# Patient Record
Sex: Female | Born: 1945 | Race: White | Hispanic: No | Smoking: Never smoker
Health system: Southern US, Community
[De-identification: ages and names within clinical notes are randomized; demographics above are authoritative.]

## PROBLEM LIST (undated history)

## (undated) DIAGNOSIS — F1910 Other psychoactive substance abuse, uncomplicated: Secondary | ICD-10-CM

## (undated) HISTORY — PX: BREAST LUMPECTOMY: SHX2

---

## 2020-12-18 ENCOUNTER — Inpatient Hospital Stay (HOSPITAL_COMMUNITY): Payer: Medicare HMO

## 2020-12-18 ENCOUNTER — Encounter (HOSPITAL_COMMUNITY): Payer: Self-pay | Admitting: Cardiology

## 2020-12-18 ENCOUNTER — Inpatient Hospital Stay (HOSPITAL_COMMUNITY)
Admission: AD | Admit: 2020-12-18 | Discharge: 2020-12-22 | DRG: 308 | Disposition: A | Discharge status: Home / Self Care | Payer: Medicare HMO | Attending: Cardiology | Admitting: Cardiology

## 2020-12-18 DIAGNOSIS — D631 Anemia in chronic kidney disease: Secondary | ICD-10-CM | POA: Diagnosis present

## 2020-12-18 DIAGNOSIS — I5021 Acute systolic (congestive) heart failure: Secondary | ICD-10-CM | POA: Diagnosis not present

## 2020-12-18 DIAGNOSIS — I272 Pulmonary hypertension, unspecified: Secondary | ICD-10-CM | POA: Diagnosis present

## 2020-12-18 DIAGNOSIS — I509 Heart failure, unspecified: Secondary | ICD-10-CM

## 2020-12-18 DIAGNOSIS — I5043 Acute on chronic combined systolic (congestive) and diastolic (congestive) heart failure: Secondary | ICD-10-CM | POA: Diagnosis present

## 2020-12-18 DIAGNOSIS — I5020 Unspecified systolic (congestive) heart failure: Secondary | ICD-10-CM

## 2020-12-18 DIAGNOSIS — Z20822 Contact with and (suspected) exposure to covid-19: Secondary | ICD-10-CM | POA: Diagnosis present

## 2020-12-18 DIAGNOSIS — Z95828 Presence of other vascular implants and grafts: Secondary | ICD-10-CM

## 2020-12-18 DIAGNOSIS — I10 Essential (primary) hypertension: Secondary | ICD-10-CM

## 2020-12-18 DIAGNOSIS — Z79890 Hormone replacement therapy (postmenopausal): Secondary | ICD-10-CM

## 2020-12-18 DIAGNOSIS — K219 Gastro-esophageal reflux disease without esophagitis: Secondary | ICD-10-CM | POA: Diagnosis present

## 2020-12-18 DIAGNOSIS — I480 Paroxysmal atrial fibrillation: Secondary | ICD-10-CM | POA: Diagnosis not present

## 2020-12-18 DIAGNOSIS — Z79899 Other long term (current) drug therapy: Secondary | ICD-10-CM

## 2020-12-18 DIAGNOSIS — Z7982 Long term (current) use of aspirin: Secondary | ICD-10-CM | POA: Diagnosis not present

## 2020-12-18 DIAGNOSIS — Z9221 Personal history of antineoplastic chemotherapy: Secondary | ICD-10-CM | POA: Diagnosis not present

## 2020-12-18 DIAGNOSIS — I371 Nonrheumatic pulmonary valve insufficiency: Secondary | ICD-10-CM | POA: Diagnosis present

## 2020-12-18 DIAGNOSIS — I5041 Acute combined systolic (congestive) and diastolic (congestive) heart failure: Secondary | ICD-10-CM

## 2020-12-18 DIAGNOSIS — N179 Acute kidney failure, unspecified: Secondary | ICD-10-CM | POA: Diagnosis present

## 2020-12-18 DIAGNOSIS — I440 Atrioventricular block, first degree: Secondary | ICD-10-CM | POA: Diagnosis present

## 2020-12-18 DIAGNOSIS — K7460 Unspecified cirrhosis of liver: Secondary | ICD-10-CM | POA: Diagnosis present

## 2020-12-18 DIAGNOSIS — E119 Type 2 diabetes mellitus without complications: Secondary | ICD-10-CM

## 2020-12-18 DIAGNOSIS — N171 Acute kidney failure with acute cortical necrosis: Secondary | ICD-10-CM

## 2020-12-18 DIAGNOSIS — J90 Pleural effusion, not elsewhere classified: Secondary | ICD-10-CM | POA: Diagnosis present

## 2020-12-18 DIAGNOSIS — E785 Hyperlipidemia, unspecified: Secondary | ICD-10-CM | POA: Diagnosis present

## 2020-12-18 DIAGNOSIS — I495 Sick sinus syndrome: Secondary | ICD-10-CM | POA: Diagnosis not present

## 2020-12-18 DIAGNOSIS — I482 Chronic atrial fibrillation, unspecified: Secondary | ICD-10-CM

## 2020-12-18 DIAGNOSIS — E039 Hypothyroidism, unspecified: Secondary | ICD-10-CM | POA: Diagnosis present

## 2020-12-18 DIAGNOSIS — I4891 Unspecified atrial fibrillation: Secondary | ICD-10-CM

## 2020-12-18 DIAGNOSIS — I130 Hypertensive heart and chronic kidney disease with heart failure and stage 1 through stage 4 chronic kidney disease, or unspecified chronic kidney disease: Secondary | ICD-10-CM | POA: Diagnosis present

## 2020-12-18 DIAGNOSIS — I4819 Other persistent atrial fibrillation: Secondary | ICD-10-CM | POA: Diagnosis present

## 2020-12-18 DIAGNOSIS — Z853 Personal history of malignant neoplasm of breast: Secondary | ICD-10-CM | POA: Diagnosis not present

## 2020-12-18 DIAGNOSIS — R188 Other ascites: Secondary | ICD-10-CM | POA: Diagnosis present

## 2020-12-18 DIAGNOSIS — E1122 Type 2 diabetes mellitus with diabetic chronic kidney disease: Secondary | ICD-10-CM | POA: Diagnosis present

## 2020-12-18 DIAGNOSIS — N184 Chronic kidney disease, stage 4 (severe): Secondary | ICD-10-CM | POA: Diagnosis present

## 2020-12-18 DIAGNOSIS — I428 Other cardiomyopathies: Secondary | ICD-10-CM

## 2020-12-18 DIAGNOSIS — Z888 Allergy status to other drugs, medicaments and biological substances status: Secondary | ICD-10-CM | POA: Diagnosis not present

## 2020-12-18 DIAGNOSIS — N189 Chronic kidney disease, unspecified: Secondary | ICD-10-CM

## 2020-12-18 HISTORY — DX: Essential (primary) hypertension: I10

## 2020-12-18 HISTORY — DX: Unspecified systolic (congestive) heart failure: I50.20

## 2020-12-18 HISTORY — DX: Unspecified cirrhosis of liver: K74.60

## 2020-12-18 HISTORY — DX: Other cardiomyopathies: I42.8

## 2020-12-18 HISTORY — DX: Chronic kidney disease, unspecified: N18.9

## 2020-12-18 HISTORY — DX: Pulmonary hypertension, unspecified: I27.20

## 2020-12-18 HISTORY — DX: Personal history of malignant neoplasm of breast: Z85.3

## 2020-12-18 HISTORY — DX: Unspecified atrial fibrillation: I48.91

## 2020-12-18 LAB — HEPATIC FUNCTION PANEL
ALT: 79 U/L — ABNORMAL HIGH (ref 0–44)
AST: 101 U/L — ABNORMAL HIGH (ref 15–41)
Albumin: 3.6 g/dL (ref 3.5–5.0)
Alkaline Phosphatase: 112 U/L (ref 38–126)
Bilirubin, Direct: 0.3 mg/dL — ABNORMAL HIGH (ref 0.0–0.2)
Indirect Bilirubin: 0.6 mg/dL (ref 0.3–0.9)
Total Bilirubin: 0.9 mg/dL (ref 0.3–1.2)
Total Protein: 7.7 g/dL (ref 6.5–8.1)

## 2020-12-18 LAB — CBC WITH DIFFERENTIAL/PLATELET
Abs Immature Granulocytes: 0.02 10*3/uL (ref 0.00–0.07)
Basophils Absolute: 0 10*3/uL (ref 0.0–0.1)
Basophils Relative: 0 %
Eosinophils Absolute: 0.1 10*3/uL (ref 0.0–0.5)
Eosinophils Relative: 2 %
HCT: 35.5 % — ABNORMAL LOW (ref 36.0–46.0)
Hemoglobin: 11.4 g/dL — ABNORMAL LOW (ref 12.0–15.0)
Immature Granulocytes: 0 %
Lymphocytes Relative: 17 %
Lymphs Abs: 0.8 10*3/uL (ref 0.7–4.0)
MCH: 32 pg (ref 26.0–34.0)
MCHC: 32.1 g/dL (ref 30.0–36.0)
MCV: 99.7 fL (ref 80.0–100.0)
Monocytes Absolute: 0.5 10*3/uL (ref 0.1–1.0)
Monocytes Relative: 12 %
Neutro Abs: 3.1 10*3/uL (ref 1.7–7.7)
Neutrophils Relative %: 69 %
Platelets: 121 10*3/uL — ABNORMAL LOW (ref 150–400)
RBC: 3.56 MIL/uL — ABNORMAL LOW (ref 3.87–5.11)
RDW: 14.3 % (ref 11.5–15.5)
WBC: 4.5 10*3/uL (ref 4.0–10.5)
nRBC: 0 % (ref 0.0–0.2)

## 2020-12-18 LAB — BRAIN NATRIURETIC PEPTIDE: B Natriuretic Peptide: 1945.1 pg/mL — ABNORMAL HIGH (ref 0.0–100.0)

## 2020-12-18 LAB — GLUCOSE, CAPILLARY
Glucose-Capillary: 86 mg/dL (ref 70–99)
Glucose-Capillary: 77 mg/dL (ref 70–99)
Glucose-Capillary: 77 mg/dL (ref 70–99)
Glucose-Capillary: 76 mg/dL (ref 70–99)
Glucose-Capillary: 189 mg/dL — ABNORMAL HIGH (ref 70–99)

## 2020-12-18 LAB — PROTIME-INR
INR: 1.1 (ref 0.8–1.2)
Prothrombin Time: 13.8 seconds (ref 11.4–15.2)

## 2020-12-18 LAB — ECHOCARDIOGRAM COMPLETE
Area-P 1/2: 3.93 cm2
Calc EF: 56.5 %
Height: 62 in
S' Lateral: 4.57 cm
Single Plane A2C EF: 56.6 %
Single Plane A4C EF: 55.4 %
Weight: 3072.33 oz

## 2020-12-18 LAB — MAGNESIUM: Magnesium: 2.4 mg/dL (ref 1.7–2.4)

## 2020-12-18 LAB — PHOSPHORUS: Phosphorus: 4.7 mg/dL — ABNORMAL HIGH (ref 2.5–4.6)

## 2020-12-18 LAB — BASIC METABOLIC PANEL
Anion gap: 13 (ref 5–15)
BUN: 67 mg/dL — ABNORMAL HIGH (ref 8–23)
CO2: 27 mmol/L (ref 22–32)
Calcium: 9.1 mg/dL (ref 8.9–10.3)
Chloride: 94 mmol/L — ABNORMAL LOW (ref 98–111)
Creatinine, Ser: 2.3 mg/dL — ABNORMAL HIGH (ref 0.44–1.00)
GFR, Estimated: 22 mL/min — ABNORMAL LOW (ref >60)
Glucose, Bld: 91 mg/dL (ref 70–99)
Potassium: 3.8 mmol/L (ref 3.5–5.1)
Sodium: 134 mmol/L — ABNORMAL LOW (ref 135–145)

## 2020-12-18 LAB — HEMOGLOBIN A1C
Hgb A1c MFr Bld: 6.6 % — ABNORMAL HIGH (ref 4.8–5.6)
Mean Plasma Glucose: 142.72 mg/dL

## 2020-12-18 LAB — MRSA PCR SCREENING: MRSA by PCR: NEGATIVE

## 2020-12-18 MED ORDER — LEVOTHYROXINE SODIUM 112 MCG PO TABS
112.0000 ug | ORAL_TABLET | Freq: Every day | ORAL | Status: DC
  Administered 2020-12-20 – 2020-12-22 (×3): 112 ug via ORAL
  Filled 2020-12-18 (×3): qty 1

## 2020-12-18 MED ORDER — FUROSEMIDE 10 MG/ML IJ SOLN
40.0000 mg | Freq: Two times a day (BID) | INTRAMUSCULAR | Status: DC
  Administered 2020-12-19 – 2020-12-21 (×5): 40 mg via INTRAVENOUS
  Filled 2020-12-18 (×5): qty 4

## 2020-12-18 MED ORDER — SODIUM CHLORIDE 0.9% FLUSH
3.0000 mL | Freq: Two times a day (BID) | INTRAVENOUS | Status: DC
  Administered 2020-12-18 – 2020-12-22 (×8): 3 mL via INTRAVENOUS

## 2020-12-18 MED ORDER — FUROSEMIDE 10 MG/ML IJ SOLN
40.0000 mg | Freq: Once | INTRAMUSCULAR | Status: AC
  Administered 2020-12-18: 40 mg via INTRAVENOUS
  Filled 2020-12-18: qty 4

## 2020-12-18 MED ORDER — SODIUM CHLORIDE 0.9% FLUSH
3.0000 mL | INTRAVENOUS | Status: DC | PRN
Start: 2020-12-18 — End: 2020-12-22

## 2020-12-18 MED ORDER — CHLORHEXIDINE GLUCONATE CLOTH 2 % EX PADS
6.0000 | MEDICATED_PAD | Freq: Every day | CUTANEOUS | Status: DC
  Administered 2020-12-18 – 2020-12-22 (×5): 6 via TOPICAL

## 2020-12-18 MED ORDER — SODIUM CHLORIDE 0.9 % IV SOLN
250.0000 mL | INTRAVENOUS | Status: DC | PRN
Start: 2020-12-18 — End: 2020-12-22

## 2020-12-18 MED ORDER — INSULIN ASPART 100 UNIT/ML ~~LOC~~ SOLN
0.0000 [IU] | SUBCUTANEOUS | Status: DC
  Administered 2020-12-18: 2 [IU] via SUBCUTANEOUS
  Administered 2020-12-19: 1 [IU] via SUBCUTANEOUS
  Administered 2020-12-20: 2 [IU] via SUBCUTANEOUS
  Administered 2020-12-20 (×3): 1 [IU] via SUBCUTANEOUS
  Administered 2020-12-21: 2 [IU] via SUBCUTANEOUS

## 2020-12-18 NOTE — Progress Notes (Signed)
Echocardiogram 2D Echocardiogram with 3D has been performed.  Felicia Ruiz M 12/18/2020, 2:58 PM

## 2020-12-18 NOTE — Progress Notes (Addendum)
Progress Note  Patient Name: Felicia Ruiz Date of Encounter: 12/18/2020  [facility] Cardiologist: New patient  Subjective   The patient feels slightly better.   Inpatient Medications    Scheduled Meds: . insulin aspart  0-9 Units Subcutaneous Q4H  . levothyroxine  112 mcg Oral QAC breakfast  . sodium chloride flush  3 mL Intravenous Q12H   Continuous Infusions: . sodium chloride     PRN Meds: sodium chloride, sodium chloride flush   Vital Signs    Vitals:   12/18/20 0204 12/18/20 0433 12/18/20 0733 12/18/20 1117  BP: 118/60 (!) 126/52 (!) 115/53 (!) 99/59  Pulse: (!) 46 (!) 43 (!) 42 (!) 47  Resp: '17 19 15 15  '$ Temp: 97.8 F (36.6 C) (!) 97.5 F (36.4 C)    TempSrc: Oral Oral    SpO2: 100% 90% 100% 100%  Weight: 87.1 kg     Height: '5\' 2"'$  (1.575 m)       Intake/Output Summary (Last 24 hours) at 12/18/2020 1150 Last data filed at 12/18/2020 0500 Gross per 24 hour  Intake 0 ml  Output 200 ml  Net -200 ml   Last 3 Weights 12/18/2020  Weight (lbs) 192 lb 0.3 oz  Weight (kg) 87.1 kg     Telemetry    Sinus bradycardia with ventricular  - Personally Reviewed  ECG    No new tracing - Personally Reviewed  Physical Exam   GEN: No acute distress.   Neck: + 6 cm JVD B/L Cardiac: RRR, no murmurs, rubs, or gallops.  Respiratory: crackles to auscultation bilaterally. GI: Soft, nontender, non-distended  MS: mild B/L LE edema; No deformity. Neuro:  Nonfocal  Psych: Normal affect   Labs    High Sensitivity Troponin:  No results for input(s): TROPONINIHS in the last 720 hours.    Chemistry Recent Labs  Lab 12/18/20 0434 12/18/20 0537  NA 134*  --   K 3.8  --   CL 94*  --   CO2 27  --   GLUCOSE 91  --   BUN 67*  --   CREATININE 2.30*  --   CALCIUM 9.1  --   PROT  --  7.7  ALBUMIN  --  3.6  AST  --  101*  ALT  --  79*  ALKPHOS  --  112  BILITOT  --  0.9  GFRNONAA 22*  --   ANIONGAP 13  --     Hematology Recent Labs  Lab  12/18/20 0434  WBC 4.5  RBC 3.56*  HGB 11.4*  HCT 35.5*  MCV 99.7  MCH 32.0  MCHC 32.1  RDW 14.3  PLT 121*    BNP Recent Labs  Lab 12/18/20 0537  BNP 1,945.1*    DDimer No results for input(s): DDIMER in the last 168 hours.   Radiology    No results found.  Cardiac Studies   Echo is pending   Patient Profile     This is a very complex 75 year old woman with numerous comorbidities, as detailed above. 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 She has previously refused device placement and other procedures, including repeat coronary angiography. She has been on amiodarone in addition to diltiazem and bisoprolol due to difficult rate control when in AF. All of these were stopped due to bradycardia. She is  grossly hypervolemic and given her other comorbidies and her difficulty providing detailed history, it is unclear how much of her exertional symptoms are related to the bradycardia. Regardless, she does seem to have an ICD indication and Class I indication for beta blocker as well, so it seems reasonable to proceed with a permeant pacing system. I would expect she will have a high pacing burden and would probably warrant BiV pacing given her low EF.  Assessment & Plan    Chronic atrial fibrillation with tachy-brady syndrome, ongoing sinus bradycardia with VR in low 40': - Holding bb, ccb, amiodarone - we will ontain echo, ECG, consult EP tomorrow for further management, possible need for a PM placement  HFrEF due to NICM, EF 35% Pulmonary hypertension Severe PR (per echo report): Grossly decompensated, CVP over 10 cm, 3+ edema.  - restart lasix 40 mg iv BID - Bidil for afterload reduction if needed.  - Daily weights, strict I&Os   Acute on chronic kidney disease: Baseline is unclear but she did have a more acute  rise during her admission. I suspect this is cardiorenal. She is grossly decompensated. Will monitor closely with diuresis.   Cirrhosis: Etiology unclear but would consider congestive hepatopathy as a potential etiology. I see she has had an EGD but unclear if she has varices. No history of HE. I don't think she actually has a contraindication to anticoagulation for her AF.   Large right pleural effusion, recurrent: Diuresis as above  Type 2 DM: most medication lists do not mention any anti-hyperglycemics. There is one mention of metformin, which was held. Will check A1c and start sliding scale insulin only for now.  For questions or updates, please contact [place] Please consult [email or web address] for contact info under     Signed, Ena Dawley, MD  12/18/2020, 11:50 AM

## 2020-12-18 NOTE — H&P (Signed)
Cardiology History & Physical    Patient ID: Felicia Ruiz MRN: [id number], DOB/AGE: 76-Jan-1947   Admit date: 12/18/2020  Primary Physician: Leone Haven, MD Primary Cardiologist: No primary care provider on file.  Patient Profile    Felicia Ruiz is a complex 75 year old woman with a history of chronic atrial fibrillation with tachy-brady syndrome, HFrEF due to NICM, pulmonary hypertension, cirrhosis, CKD III-IV, recurrent pleural effusion, HTN, type 2 DM, history of breast cancer with LND/chemo with port still in place, hypothyroidism, and chronic anemia who was transferred for placement of ICD/PPM given her reduced EF and tachy-brady syndrome.   History of Present Illness    Very difficult to obtain coherent history. From what I can tell, she has long-standing atrial fibrillation, primarily managed with rate control. She has been on a combination of high-dose bisoprolol, cardizem '120mg'$ , low-dose digoxin, and amiodarone, though digoxin had previously been stopped. No other AAD trials or discussion of ablation as far as I can tell. She is not on anticoagulation due ot history of cirrhosis, which she did confirm. There is mention of a GI bleed, but she did not recall any such event and denies any abnormal bleeding. Apparently, the tachy-brady issues are not new. She has been on multiple agents for rate control due to difficulties with this in the past when she goes into AF, however frequently develops bradycardia as a result. She has refused ICD/PPM in the past.   She thinks she went to the ED because her heart rate was low when she checked her blood pressure. She also describes pain in her neck and shoulder, as well as shortness of breath and fatigue/weakness, but these seem to be fairly chronic issues. There is mention of dizziness in her outside records as well. Denies syncope. The dyspnea has been getting progressively worse however. She does have orthopnea as well. She presented  to [facility] in [place] with a heart rate in the 30s, sinus rhythm with first degree AVB. She tells me heart rates in the 40s are not typical for her. Amiodarone, diltiazem, and metoprolol were all held but HR remains in the 40s. She says her symptoms have not changed since admission. She had a repeat TTE that reported an EF of 35%, so ICD was again discussed given her bradycardia as well. She was agreeable and transferred here for ICD/PPM. Of note, her TTE report also mentions severe pulmonic regurgitation. It was noted that her Lasix and metolazone (both being given at home doses) were planned to be held due to rising creatinine and "near euvolemia" - I will note that she is grossly hypervolemic on my exam.  Past Medical History   Past Medical History:  Diagnosis Date  . Atrial fibrillation ([place]) 12/18/2020  . Chronic kidney disease (CKD) 12/18/2020  . Cirrhosis of liver ([place]) 12/18/2020  . Essential hypertension 12/18/2020  . HFrEF (heart failure with reduced ejection fraction) ([place]) 12/18/2020  . History of breast cancer 12/18/2020  . Nonischemic cardiomyopathy ([place]) 12/18/2020  . Pulmonary hypertension ([place]) 12/18/2020    Allergies Allergies  Allergen Reactions  . Levofloxacin     Home Medications    Prior to Admission medications   Medication Sig Start Date End Date Taking? Authorizing Provider  allopurinol (ZYLOPRIM) 100 MG tablet Take 100 mg by mouth daily.   Yes [provider]  amiodarone (PACERONE) 200 MG tablet Take 200 mg by mouth daily.   Yes [provider]  aspirin EC 81 MG tablet Take 81 mg  by mouth daily. Swallow whole.   Yes [provider]  bisoprolol (ZEBETA) 10 MG tablet Take 15 mg by mouth daily.   Yes [provider]  diltiazem (CARDIZEM) 120 MG tablet Take 120 mg by mouth 4 (four) times daily.   Yes [provider]  furosemide (LASIX) 40 MG tablet Take 40 mg by mouth.   Yes [provider]  levothyroxine  (SYNTHROID) 112 MCG tablet Take 112 mcg by mouth daily before breakfast.   Yes [provider]  magnesium oxide (MAG-OX) 400 MG tablet Take 400 mg by mouth daily.   Yes [provider]  metolazone (ZAROXOLYN) 2.5 MG tablet Take 2.5 mg by mouth daily.   Yes [provider]    Family History    No family history on file. has no family status information on file.    Social History    Social History   Socioeconomic History  . Marital status: Widowed    Spouse name: Not on file  . Number of children: Not on file  . Years of education: Not on file  . Highest education level: Not on file  Occupational History  . Not on file  Tobacco Use  . Smoking status: Not on file  . Smokeless tobacco: Not on file  Substance and Sexual Activity  . Alcohol use: Not on file  . Drug use: Not on file  . Sexual activity: Not on file  Other Topics Concern  . Not on file  Social History Narrative  . Not on file   Social Determinants of Health   Financial Resource Strain: Not on file  Food Insecurity: Not on file  Transportation Needs: Not on file  Physical Activity: Not on file  Stress: Not on file  Social Connections: Not on file  Intimate Partner Violence: Not on file     Review of Systems    A comprehensive review of systems was performed with pertinent positives and negatives noted in the HPI.  Physical Exam    BP 118/60 (BP Location: Left Arm)   Pulse (!) 46   Temp 97.8 F (36.6 C) (Oral)   Resp 17   Ht '5\' 2"'$  (1.575 m)   Wt 87.1 kg   SpO2 100%   BMI 35.12 kg/m  General: Alert, chronically ill appearing, mild hirsituism HEENT: sclera anicteric, face is symmetric, no lesions. Neck: JVP is nearly to the mandible at 90 degrees, CVP likely > 15.  Lungs:  Absent right breath sounds from base to mid-lung. Right apex and left lung clear. Normal wob.  Heart: Extremely muffled heart sounds, no precordial heave Abdomen: Soft, non-tender, non-distended, BS  +.  Extremities: Slightly cool. 3+ edema bilaterally. No clubbing or cyanosis. Radials 2+ and equal bilaterally. Psych: Normal affect. Neuro: Alert and oriented. No gross focal deficits. No abnormal movements.  Labs     Lab Results  Component Value Date   WBC 4.5 12/18/2020   HGB 11.4 (L) 12/18/2020   HCT 35.5 (L) 12/18/2020   MCV 99.7 12/18/2020   PLT 121 (L) 12/18/2020    Recent Labs  Lab 12/18/20 0434  NA 134*  K 3.8  CL 94*  CO2 27  BUN 67*  CREATININE 2.30*  CALCIUM 9.1  GLUCOSE 91   BNP No results found for: BNP  ProBNP No results found for: PROBNP    Radiology Studies    No results found.  ECG & Cardiac Imaging    Outside echo report, 12/18/2019: Normal LV size,  moderate LVH, LVEF 35% Normal RV size with moderately decreased function Left atrium mildly dilated. Moderate MR, Moderate TR, Severe PR.  PASP 31, RAP 5. No pericardial effusion.   Assessment & Plan    This is a very complex 75 year old woman with numerous comorbidities, as detailed above. 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 She has previously refused device placement and other procedures, including repeat coronary angiography. She has been on amiodarone in addition to diltiazem and bisoprolol due to difficult rate control when in AF. All of these were stopped due to bradycardia. She is grossly hypervolemic and given her other comorbidies and her difficulty providing detailed history, it is unclear how much of her exertional symptoms are related to the bradycardia. Regardless, she does seem to have an ICD indication and Class I indication for beta blocker as well, so it seems reasonable to proceed with a permeant pacing system. I would expect she will have a high pacing burden and would probably warrant BiV pacing given her low  EF.  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 She should be on Eliquis if tolerating aspirin, but will defer for device implant. - Could consider dofetilide and/or ablation for rhythm control to potentially minimize pacing.  HFrEF due to NICM, EF 35% Pulmonary hypertension Severe PR (per echo report): Grossly decompensated, CVP over 10 cm, 3+ edema. Worsening renal function and slight increase in transaminases is likely secondary to right heart failure. Perfusion is marginal. The echo read of severe PR is unusual and warrants further review.  - Echo images available for review on disc in paper chart.  - Lasix '40mg'$  IV, assess response. Needs significant diuresis. - Holding on BB due to bradycardia, holding on RAAS inhibitors due to renal function - Bidil for afterload reduction if needed.  - Daily weights, strict I&Os   Acute on chronic kidney disease: Baseline is unclear but she did have a more acute rise during her admission. I suspect this is cardiorenal. She is grossly decompensated. Will monitor closely with diuresis.   Cirrhosis: Etiology unclear but would consider congestive hepatopathy as a potential etiology. I see she has had an EGD but unclear if she has varices. No history of HE. I don't think she actually has a contraindication to anticoagulation for her AF.   Large right pleural effusion, recurrent: Diuresis as above  Type 2 DM: most medication lists do not mention any anti-hyperglycemics. There is one mention of metformin, which was held. Will check A1c and start sliding scale insulin only for now.  Nutrition: NPO DVT ppx: SCDs Advanced Care Planning: Wishes to be Full Code while she considers this further. She mentioned that she would not want to be put on a "heart machine" (ie ECMO).    Signed, Marykay Lex, MD 12/18/2020, 3:42 AM

## 2020-12-18 NOTE — Progress Notes (Addendum)
Patient arrived by ambulance from [facility]. Left chest port was accessed at their facility. Patient AOx4, on 2L O2. MD paged regarding orders. Pt resting comfortably in bed. Will continue to monitor.

## 2020-12-18 NOTE — Plan of Care (Signed)
  Problem: Clinical Measurements: Goal: Ability to maintain clinical measurements within normal limits will improve Outcome: Progressing   Problem: Clinical Measurements: Goal: Cardiovascular complication will be avoided Outcome: Progressing   Problem: Pain Managment: Goal: General experience of comfort will improve Outcome: Progressing   

## 2020-12-18 NOTE — Plan of Care (Signed)

## 2020-12-18 NOTE — TOC Progression Note (Signed)
Transition of Care [facility]) - Progression Note    Patient Details  Name: Felicia Ruiz MRN: [id number] Date of Birth: Jan 04, 1946  [facility] CM/SW Contact  Zenon Mayo, RN Phone Number: 12/18/2020, 2:13 PM  Clinical Narrative:    NCM spoke with patient, she lives with her son. She states she uses a rollator at home. She has a scale and a bp cuff. She tries to eat a no salt diet but could do better.  She has transportation to her MD apts.  TOC will cont to follow for dc needs.        Expected Discharge Plan and Services                                                 Social Determinants of Health (SDOH) Interventions    Readmission Risk Interventions No flowsheet data found.

## 2020-12-19 DIAGNOSIS — I5041 Acute combined systolic (congestive) and diastolic (congestive) heart failure: Secondary | ICD-10-CM

## 2020-12-19 LAB — COMPREHENSIVE METABOLIC PANEL
ALT: 54 U/L — ABNORMAL HIGH (ref 0–44)
AST: 58 U/L — ABNORMAL HIGH (ref 15–41)
Albumin: 3 g/dL — ABNORMAL LOW (ref 3.5–5.0)
Alkaline Phosphatase: 108 U/L (ref 38–126)
Anion gap: 9 (ref 5–15)
BUN: 65 mg/dL — ABNORMAL HIGH (ref 8–23)
CO2: 34 mmol/L — ABNORMAL HIGH (ref 22–32)
Calcium: 9.3 mg/dL (ref 8.9–10.3)
Chloride: 92 mmol/L — ABNORMAL LOW (ref 98–111)
Creatinine, Ser: 2.31 mg/dL — ABNORMAL HIGH (ref 0.44–1.00)
GFR, Estimated: 22 mL/min — ABNORMAL LOW (ref >60)
Glucose, Bld: 97 mg/dL (ref 70–99)
Potassium: 3.3 mmol/L — ABNORMAL LOW (ref 3.5–5.1)
Sodium: 135 mmol/L (ref 135–145)
Total Bilirubin: 0.9 mg/dL (ref 0.3–1.2)
Total Protein: 6.4 g/dL — ABNORMAL LOW (ref 6.5–8.1)

## 2020-12-19 LAB — GLUCOSE, CAPILLARY
Glucose-Capillary: 103 mg/dL — ABNORMAL HIGH (ref 70–99)
Glucose-Capillary: 112 mg/dL — ABNORMAL HIGH (ref 70–99)
Glucose-Capillary: 129 mg/dL — ABNORMAL HIGH (ref 70–99)
Glucose-Capillary: 79 mg/dL (ref 70–99)
Glucose-Capillary: 80 mg/dL (ref 70–99)
Glucose-Capillary: 85 mg/dL (ref 70–99)

## 2020-12-19 NOTE — Consult Note (Addendum)
Cardiology Consultation:   Patient ID: Meggie Cumings MRN: [id number]; DOB: 09/26/1946  Admit date: 12/18/2020 Date of Consult: 12/19/2020  PCP:  Leone Haven, [place]  Cardiologist:  Dr. Chancy Milroy   Patient Profile:   Felicia Ruiz is a 75 y.o. 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 Meda Coffee.  History of Present Illness:   Ms. Stamos sought medical attention at [facility] in [place], [place]  On 12/16/20 with a couple days c/o CP and dizziness with exertion. She also noted her HR slow.  She was seen by cardiology , Dr. Patsi Sears that noted a hx of CHF with EF 30's who declined ICD in the past, also mentions known tachy-brady describing that he HR gets OOC when her meds are reduced, and have required intermittent titration up/down of her meds, and had also declined PPM implant.  She declined anticoagulation (also mentions her cirrhosis and hx of bleeding), and of late, declined cath 2/2 prior bad experience with the procedure. (unclear). On arrival there HR was in the 30's. He reports historically has been on different combinations and doses of amio, dilt, dig, bisoprolol. Leading to this stay she was on  amio '200mg'$  daily bisoprolol '15mg'$  daily Diltiazem '120mg'$  daily  Described numerous visits with c/o CP some with abn Trop, LHC 2013  30% ostial LCx diseae only. Last stress test was May 2019, EF 24%, mod size area of mild ischemia anterior wall, , though also extensive photon attenuation from breast.  Echo there done 12/17/20  LVEF 35%, mod conc LVH Mod RVEF reduction, normal RV size Mod MR, TR, severe PI  Admit labs TSH 4.67 HS Trop 35, 31, 32 K+ 4.3 AST 52 ALT 34 Mag 2.7 BUN/Creat  62/2.49 WC 5.3 H/H 10/33 Plts 142  R sided pleural effusion (recurrent) (previously tapped the month prior, cytology showed reactive mesothelial and inflammatory cells)  She was admitted her amio, bisoprolol and dilt held, recommended transfer for consideration of ICD/PPM, the patient apparently agreeable to consider at this time  [facility] yesterday morning, felt to be grossly volume OL, started on IV lasix (on metolazone and lasix at home), and planned for EP evaluation as well.  Her TTE here much different, LVEF 50-55%, no WMA. RV function mod reduced, RV severely enlarged, RVSP 58.7 mild MR, mod TR, mod PI   BNP 1946 No intake charted, OP cumulative is 2134m, Wt down 1lb CXR remains with mod-large R effusion today  Labs today K+ 3.3 BUN/Creat 65/2.31   She has been ambulating back/forth to the BR without dizziness or difficulties, no CP. She is with SOB at rest No near syncope or syncope at home or here   Past Medical History:  Diagnosis Date  . Atrial fibrillation ([facility] 12/18/2020  . Chronic kidney disease (CKD) 12/18/2020  . Cirrhosis of liver ([facility] 12/18/2020  . Essential hypertension 12/18/2020  . HFrEF (heart failure with reduced ejection fraction) ([facility] 12/18/2020  . History of breast cancer 12/18/2020  . Nonischemic cardiomyopathy ([facility] 12/18/2020  . Pulmonary hypertension ([facility] 12/18/2020       Home Medications:  Prior to Admission medications   Medication Sig Start Date End Date Taking? Authorizing Provider  allopurinol (ZYLOPRIM) 100 MG tablet Take  100 mg by mouth daily.   Yes [provider]  amiodarone (PACERONE) 200 MG tablet Take 200 mg by mouth daily.   Yes [provider]  aspirin EC 81 MG tablet Take 81 mg by mouth daily. Swallow whole.   Yes [provider]  bisoprolol (ZEBETA) 10 MG tablet Take 15 mg by mouth daily.   Yes [provider]  diltiazem (CARDIZEM CD) 120 MG 24 hr capsule Take 120 mg by mouth  daily.   Yes [provider]  furosemide (LASIX) 40 MG tablet Take 40 mg by mouth daily.   Yes [provider]  levothyroxine (SYNTHROID) 112 MCG tablet Take 112 mcg by mouth daily before breakfast.   Yes [provider]  MAGNESIUM OXIDE PO Take 250 mg by mouth in the morning, at noon, and at bedtime.   Yes [provider]  metolazone (ZAROXOLYN) 2.5 MG tablet Take 2.5 mg by mouth daily.   Yes [provider]  potassium chloride (MICRO-K) 10 MEQ CR capsule Take 10 mEq by mouth 2 (two) times daily. 12/16/20  Yes [provider]    Inpatient Medications: Scheduled Meds: . Chlorhexidine Gluconate Cloth  6 each Topical Daily  . furosemide  40 mg Intravenous BID  . insulin aspart  0-9 Units Subcutaneous Q4H  . levothyroxine  112 mcg Oral QAC breakfast  . sodium chloride flush  3 mL Intravenous Q12H   Continuous Infusions: . sodium chloride     PRN Meds: sodium chloride, sodium chloride flush  Allergies:    Allergies  Allergen Reactions  . Levofloxacin     UNK reaction    Social History:   Social History   Socioeconomic History  . Marital status: Widowed    Spouse name: Not on file  . Number of children: Not on file  . Years of education: Not on file  . Highest education level: Not on file  Occupational History  . Not on file  Tobacco Use  . Smoking status: Not on file  . Smokeless tobacco: Not on file  Substance and Sexual Activity  . Alcohol use: Not on file  . Drug use: Not on file  . Sexual activity: Not on file  Other Topics Concern  . Not on file  Social History Narrative  . Not on file   Social Determinants of Health   Financial Resource Strain: Not on file  Food Insecurity: Not on file  Transportation Needs: Not on file  Physical Activity: Not on file  Stress: Not on file  Social Connections: Not on file  Intimate Partner Violence: Not on file    Family History:   She is  Not certain, 2 brothers have  had cancer of some kind, her Mom had a pacemaker  ROS:  Please see the history of present illness.  All other ROS reviewed and negative.     Physical Exam/Data:   Vitals:   12/18/20 2011 12/18/20 2330 12/19/20 0325 12/19/20 0824  BP: (!) 116/58 (!) 130/57 (!) 119/59 (!) 135/59  Pulse: (!) 43 (!) 43 (!) 43 (!) 46  Resp: '17 20 19 18  '$ Temp: (!) 97.4 F (36.3 C) (!) 97.5 F (36.4 C) 98.1 F (36.7 C) 97.9 F (36.6 C)  TempSrc: Oral Oral Oral Oral  SpO2: 99% 100% 98% 98%  Weight:   86.7 kg   Height:        Intake/Output Summary (Last 24 hours) at 12/19/2020 1126 Last data filed at 12/19/2020 1053 Gross per  24 hour  Intake 3 ml  Output 1950 ml  Net -1947 ml   Last 3 Weights 12/19/2020 12/18/2020  Weight (lbs) 191 lb 2.2 oz 192 lb 0.3 oz  Weight (kg) 86.7 kg 87.1 kg     Body mass index is 34.96 kg/m.  General:  Well nourished, well developed, in no acute distress HEENT: normal Lymph: no adenopathy Neck: no JVD Endocrine:  No thryomegaly Vascular: No carotid bruits; FA pulses 2+ bilaterally without bruits  Cardiac:  RRR; bradycardic, no murmur Lungs:  diminished at the bases R>L, no wheezing, rhonchi or rales  Abd: soft, nontender, obese Ext: 1++ edema Musculoskeletal:  No deformities Skin: warm and dry  Neuro:  No gross focal abnormalities noted Psych:  Normal affect   EKG:  The EKG was personally reviewed and demonstrates:   No EKGs from [place]  #1 SB 48bpm, 1st degree AVblock, 231m, IVCD/LBBB like, QRS 1378m LAD #2 today is low atrial/junctional rhythm 48bpm, , QRS 13257mTelemetry:  Telemetry was personally reviewed and demonstrates:   SB/junctional 40's-50, dipped to 38-40's around 0400  Relevant CV Studies:  12/18/20: TTE IMPRESSIONS  1. Left ventricular ejection fraction, by estimation, is 50 to 55%. Left  ventricular ejection fraction by 3D volume is 53 %. The left ventricle has  low normal function. The left ventricle has no regional wall motion   abnormalities. Left ventricular  diastolic function could not be evaluated. There is the interventricular  septum is flattened in systole and diastole, consistent with right  ventricular pressure and volume overload.  2. Right ventricular systolic function is moderately reduced. The right  ventricular size is severely enlarged. There is moderately elevated  pulmonary artery systolic pressure. The estimated right ventricular  systolic pressure is 58.A999333Hg.  3. Right atrial size was moderately dilated.  4. The mitral valve is degenerative. Mild mitral valve regurgitation. No  evidence of mitral stenosis. Moderate to severe mitral annular  calcification.  5. The tricuspid valve is abnormal. Tricuspid valve regurgitation is  moderate.  6. The aortic valve is tricuspid. There is mild calcification of the  aortic valve. Aortic valve regurgitation is not visualized. Mild aortic  valve sclerosis is present, with no evidence of aortic valve stenosis.  7. Pulmonic valve regurgitation is moderate.  8. The inferior vena cava is normal in size with <50% respiratory  variability, suggesting right atrial pressure of 8 mmHg.   The day prior in [facility] [place]15/22 LVEF reported 35%  Laboratory Data:  High Sensitivity Troponin:  No results for input(s): TROPONINIHS in the last 720 hours.   Chemistry Recent Labs  Lab 12/18/20 0434 12/19/20 0443  NA 134* 135  K 3.8 3.3*  CL 94* 92*  CO2 27 34*  GLUCOSE 91 97  BUN 67* 65*  CREATININE 2.30* 2.31*  CALCIUM 9.1 9.3  GFRNONAA 22* 22*  ANIONGAP 13 9    Recent Labs  Lab 12/18/20 0537 12/19/20 0443  PROT 7.7 6.4*  ALBUMIN 3.6 3.0*  AST 101* 58*  ALT 79* 54*  ALKPHOS 112 108  BILITOT 0.9 0.9   Hematology Recent Labs  Lab 12/18/20 0434  WBC 4.5  RBC 3.56*  HGB 11.4*  HCT 35.5*  MCV 99.7  MCH 32.0  MCHC 32.1  RDW 14.3  PLT 121*   BNP Recent Labs  Lab 12/18/20 0537  BNP 1,945.1*    DDimer No results for input(s): DDIMER in  the last 168 hours.   Radiology/Studies:  DG Chest 1 View Result  Date: 12/18/2020 CLINICAL DATA:  Shortness of breath. EXAM: CHEST  1 VIEW COMPARISON:  None. FINDINGS: The patient has a moderately large right pleural effusion and airspace disease. The left lung is clear. Cardiomegaly. Port-A-Cath is in place. IMPRESSION: Moderately large right pleural effusion and airspace disease which could be due to atelectasis or pneumonia. Electronically Signed   By: Inge Rise M.D.   On: 12/18/2020 12:45     Assessment and Plan:   1. Tachy-brady      Apparently longstanding and known for her      Historically had refused pacing/ICD implant      HR stable 40's- low 50's, BP stable      Stay off nodal blocking agents (off home meds starting 12/16/20, none 15th and 16th)        2. NICM 3. Chronic CHF  She is volume OL CXR still with mod-large R effusion, this is a recurrent issue for her and historically required thoracentesis. Will defer to attending cardiology team  Strange such different EF reports  In review of cardiology consult by Dr. Hebert Soho who seems to know her well, seems she has a longstanding hx of NICM with LVEF 30's I discussed with Dr. Meda Coffee who read her echo here is accurate, LVEF 50-55%  I dont think we are in urgent need for pacing I dont know if LBBB/IVCD is new  The patient is willing to pursue PPM, she is a little reluctant about ICD, though would give it more consideration if MD felt indicated  4. Paroxysmal Afib     CHA2DS2Vasc is 6, she has declined a/c    She is comfortable, in no distress, and do not think we need to proceed with any device today. Would continue to diurese her today, will discuss case with Dr. Rayann Heman And perhaps consider device tomorrow  She has port currently L chest, s/p lumpectomy/lymphnode surgery on right (2009)  OK to eat  Dr. Rayann Heman will see her later today      Risk Assessment/Risk Scores:  {  For questions or  updates, please contact [facility] Please consult [email or web address] for contact info under    Signed, Baldwin Jamaica, PA-C  12/19/2020 11:26 AM    I have seen, examined the patient, and reviewed the above assessment and plan.  Changes to above are made where necessary.  On exam, RRR.  Chronically ill appearing.  Very difficult situation due to discrepancy between current and prior echos.  Here, her EF is normal.  Sinus rates have improved.  Could consider restarting low dose amiodarone if sinus rates remain improved.  If her sinus rates decline further, will likely need pacing.   Dr Lovena Le to follow-up tomorrow.    Co Sign: Thompson Grayer, MD 12/19/2020 8:56 PM

## 2020-12-19 NOTE — Progress Notes (Signed)
Progress Note  Patient Name: Felicia Ruiz Date of Encounter: 12/19/2020  [facility] Cardiologist: New patient  Subjective   The patient feels slightly better. Improved breathing.  Inpatient Medications    Scheduled Meds: . Chlorhexidine Gluconate Cloth  6 each Topical Daily  . furosemide  40 mg Intravenous BID  . insulin aspart  0-9 Units Subcutaneous Q4H  . levothyroxine  112 mcg Oral QAC breakfast  . sodium chloride flush  3 mL Intravenous Q12H   Continuous Infusions: . sodium chloride     PRN Meds: sodium chloride, sodium chloride flush   Vital Signs    Vitals:   12/18/20 2011 12/18/20 2330 12/19/20 0325 12/19/20 0824  BP: (!) 116/58 (!) 130/57 (!) 119/59 (!) 135/59  Pulse: (!) 43 (!) 43 (!) 43 (!) 46  Resp: '17 20 19 18  '$ Temp: (!) 97.4 F (36.3 C) (!) 97.5 F (36.4 C) 98.1 F (36.7 C) 97.9 F (36.6 C)  TempSrc: Oral Oral Oral Oral  SpO2: 99% 100% 98% 98%  Weight:   86.7 kg   Height:        Intake/Output Summary (Last 24 hours) at 12/19/2020 0926 Last data filed at 12/19/2020 0500 Gross per 24 hour  Intake 3 ml  Output 1050 ml  Net -1047 ml   Last 3 Weights 12/19/2020 12/18/2020  Weight (lbs) 191 lb 2.2 oz 192 lb 0.3 oz  Weight (kg) 86.7 kg 87.1 kg     Telemetry    Sinus bradycardia with ventricular rates in low 40' - Personally Reviewed  ECG    No new tracing - Personally Reviewed  Physical Exam   GEN: No acute distress.   Neck: + 6 cm JVD B/L Cardiac: RRR, no murmurs, rubs, or gallops.  Respiratory: crackles to auscultation bilaterally. GI: Soft, nontender, non-distended  MS: mild B/L LE edema; No deformity. Neuro:  Nonfocal  Psych: Normal affect   Labs    High Sensitivity Troponin:  No results for input(s): TROPONINIHS in the last 720 hours.    Chemistry Recent Labs  Lab 12/18/20 0434 12/18/20 0537 12/19/20 0443  NA 134*  --  135  K 3.8  --  3.3*  CL 94*  --  92*  CO2 27  --  34*  GLUCOSE 91  --  97  BUN 67*  --   65*  CREATININE 2.30*  --  2.31*  CALCIUM 9.1  --  9.3  PROT  --  7.7 6.4*  ALBUMIN  --  3.6 3.0*  AST  --  101* 58*  ALT  --  79* 54*  ALKPHOS  --  112 108  BILITOT  --  0.9 0.9  GFRNONAA 22*  --  22*  ANIONGAP 13  --  9    Hematology Recent Labs  Lab 12/18/20 0434  WBC 4.5  RBC 3.56*  HGB 11.4*  HCT 35.5*  MCV 99.7  MCH 32.0  MCHC 32.1  RDW 14.3  PLT 121*    BNP Recent Labs  Lab 12/18/20 0537  BNP 1,945.1*    DDimer No results for input(s): DDIMER in the last 168 hours.   Radiology    DG Chest 1 View  Result Date: 12/18/2020 CLINICAL DATA:  Shortness of breath. EXAM: CHEST  1 VIEW COMPARISON:  None. FINDINGS: The patient has a moderately large right pleural effusion and airspace disease. The left lung is clear. Cardiomegaly. Port-A-Cath is in place. IMPRESSION: Moderately large right pleural effusion and airspace disease which could be  due to atelectasis or pneumonia.   Cardiac Studies   Echo 12/18/2020  1. Left ventricular ejection fraction, by estimation, is 50 to 55%. Left  ventricular ejection fraction by 3D volume is 53 %. The left ventricle has  low normal function. The left ventricle has no regional wall motion  abnormalities. Left ventricular  diastolic function could not be evaluated. There is the interventricular  septum is flattened in systole and diastole, consistent with right  ventricular pressure and volume overload.  2. Right ventricular systolic function is moderately reduced. The right  ventricular size is severely enlarged. There is moderately elevated  pulmonary artery systolic pressure. The estimated right ventricular  systolic pressure is [id number] mmHg.  3. Right atrial size was moderately dilated.  4. The mitral valve is degenerative. Mild mitral valve regurgitation. No  evidence of mitral stenosis. Moderate to severe mitral annular  calcification.  5. The tricuspid valve is abnormal. Tricuspid valve regurgitation is  moderate.   6. The aortic valve is tricuspid. There is mild calcification of the  aortic valve. Aortic valve regurgitation is not visualized. Mild aortic  valve sclerosis is present, with no evidence of aortic valve stenosis.  7. Pulmonic valve regurgitation is moderate.  8. The inferior vena cava is normal in size with <50% respiratory  variability, suggesting right atrial pressure of 8 mmHg.    Patient Profile     This is a very complex 75 year old woman with numerous comorbidities, as detailed above. 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 She has previously refused device placement and other procedures, including repeat coronary angiography. She has been on amiodarone in addition to diltiazem and bisoprolol due to difficult rate control when in AF. All of these were stopped due to bradycardia. She is grossly hypervolemic and given her other comorbidies and her difficulty providing detailed history, it is unclear how much of her exertional symptoms are related to the bradycardia. Regardless, she does seem to have an ICD indication and Class I indication for beta blocker as well, so it seems reasonable to proceed with a permeant pacing system. I would expect she will have a high pacing burden and would probably warrant BiV pacing given her low EF.  Assessment & Plan    Paroxysmal atrial fibrillation with tachy-brady syndrome, persistent sinus bradycardia - Holding bb, ccb, amiodarone, previously on Diltiazem and bisoprolol, last dose on 12/16/2020 - she remains in Sinus bradycardia with ventricular rates in low 40' - we will consult EP for consideration of a PM placement  Acute on chronic combined CEF, repeat echo here showed LVEF 50-55% Fluid overloaded improved, negative 1 L overnight, Crea 2.3 - continue lasix 40 mg iv BID - Bidil for  afterload reduction if needed.  - Daily weights, strict I&Os  Pulmonary hypertension - RVSP 60 mmHg, RV fluid and volume overload on echo Severe PR (per echo report):   Acute on chronic kidney disease: Baseline is unclear but she did have a more acute rise during her admission. I suspect this is cardiorenal. She is grossly decompensated. Will monitor closely with diuresis.   Cirrhosis: Etiology unclear but would consider congestive hepatopathy as a potential etiology. I see she has had an EGD but unclear if she has varices. No history of HE. I don't think she actually has a contraindication to anticoagulation for her AF.  Large right pleural effusion, recurrent: Diuresis as above  Type 2 DM: most medication lists do not mention any anti-hyperglycemics. There is one mention of metformin, which was held. Will check A1c and start sliding scale insulin only for now.  For questions or updates, please contact [place] Please consult [email or web address] for contact info under     Signed, Ena Dawley, MD  12/19/2020, 9:26 AM

## 2020-12-20 DIAGNOSIS — I440 Atrioventricular block, first degree: Secondary | ICD-10-CM

## 2020-12-20 LAB — GLUCOSE, CAPILLARY
Glucose-Capillary: 122 mg/dL — ABNORMAL HIGH (ref 70–99)
Glucose-Capillary: 126 mg/dL — ABNORMAL HIGH (ref 70–99)
Glucose-Capillary: 134 mg/dL — ABNORMAL HIGH (ref 70–99)
Glucose-Capillary: 136 mg/dL — ABNORMAL HIGH (ref 70–99)
Glucose-Capillary: 137 mg/dL — ABNORMAL HIGH (ref 70–99)
Glucose-Capillary: 151 mg/dL — ABNORMAL HIGH (ref 70–99)
Glucose-Capillary: 68 mg/dL — ABNORMAL LOW (ref 70–99)
Glucose-Capillary: 84 mg/dL (ref 70–99)

## 2020-12-20 LAB — COMPREHENSIVE METABOLIC PANEL
ALT: 55 U/L — ABNORMAL HIGH (ref 0–44)
AST: 71 U/L — ABNORMAL HIGH (ref 15–41)
Albumin: 3.3 g/dL — ABNORMAL LOW (ref 3.5–5.0)
Alkaline Phosphatase: 108 U/L (ref 38–126)
Anion gap: 7 (ref 5–15)
BUN: 64 mg/dL — ABNORMAL HIGH (ref 8–23)
CO2: 39 mmol/L — ABNORMAL HIGH (ref 22–32)
Calcium: 9.3 mg/dL (ref 8.9–10.3)
Chloride: 89 mmol/L — ABNORMAL LOW (ref 98–111)
Creatinine, Ser: 2.21 mg/dL — ABNORMAL HIGH (ref 0.44–1.00)
GFR, Estimated: 23 mL/min — ABNORMAL LOW (ref >60)
Glucose, Bld: 111 mg/dL — ABNORMAL HIGH (ref 70–99)
Potassium: 5 mmol/L (ref 3.5–5.1)
Sodium: 135 mmol/L (ref 135–145)
Total Bilirubin: 1.4 mg/dL — ABNORMAL HIGH (ref 0.3–1.2)
Total Protein: 6.8 g/dL (ref 6.5–8.1)

## 2020-12-20 MED ORDER — ALTEPLASE 2 MG IJ SOLR
2.0000 mg | Freq: Once | INTRAMUSCULAR | Status: AC
  Administered 2020-12-20: 2 mg
  Filled 2020-12-20: qty 2

## 2020-12-20 MED ORDER — DEXTROSE 50 % IV SOLN
12.5000 g | INTRAVENOUS | Status: AC
  Administered 2020-12-20: 12.5 g via INTRAVENOUS

## 2020-12-20 MED ORDER — DEXTROSE 50 % IV SOLN
INTRAVENOUS | Status: AC
  Filled 2020-12-20: qty 50

## 2020-12-20 NOTE — Plan of Care (Signed)

## 2020-12-20 NOTE — Progress Notes (Signed)
Progress Note  Patient Name: Madylan Bigness Date of Encounter: 12/20/2020  [facility] Cardiologist: New patient  Subjective   The patient feels slightly better. Improved breathing.  Inpatient Medications    Scheduled Meds: . Chlorhexidine Gluconate Cloth  6 each Topical Daily  . furosemide  40 mg Intravenous BID  . insulin aspart  0-9 Units Subcutaneous Q4H  . levothyroxine  112 mcg Oral QAC breakfast  . sodium chloride flush  3 mL Intravenous Q12H   Continuous Infusions: . sodium chloride     PRN Meds: sodium chloride, sodium chloride flush   Vital Signs    Vitals:   12/20/20 0734 12/20/20 0800 12/20/20 0822 12/20/20 1142  BP: (!) 131/57  (!) 126/56 (!) 125/54  Pulse: (!) 47 (!) 53 (!) 48 (!) 42  Resp: 19 13 (!) 8 20  Temp:   (!) 97.5 F (36.4 C) 97.6 F (36.4 C)  TempSrc:   Oral Oral  SpO2: 99%  99% 100%  Weight:      Height:        Intake/Output Summary (Last 24 hours) at 12/20/2020 1159 Last data filed at 12/20/2020 1031 Gross per 24 hour  Intake 810 ml  Output 3675 ml  Net -2865 ml   Last 3 Weights 12/20/2020 12/19/2020 12/18/2020  Weight (lbs) 182 lb 1.6 oz 191 lb 2.2 oz 192 lb 0.3 oz  Weight (kg) 82.6 kg 86.7 kg 87.1 kg     Telemetry    Sinus bradycardia with ventricular rates in low 40' - Personally Reviewed  ECG    No new tracing - Personally Reviewed  Physical Exam   GEN: No acute distress.   Neck: + 6 cm JVD B/L Cardiac: RRR, no murmurs, rubs, or gallops.  Respiratory: crackles to auscultation bilaterally. GI: Soft, nontender, non-distended  MS: mild B/L LE edema; No deformity. Neuro:  Nonfocal  Psych: Normal affect   Labs    High Sensitivity Troponin:  No results for input(s): TROPONINIHS in the last 720 hours.    Chemistry Recent Labs  Lab 12/18/20 0434 12/18/20 0537 12/19/20 0443 12/20/20 0055  NA 134*  --  135 135  K 3.8  --  3.3* 5.0  CL 94*  --  92* 89*  CO2 27  --  34* 39*  GLUCOSE 91  --  97 111*  BUN 67*   --  65* 64*  CREATININE 2.30*  --  2.31* 2.21*  CALCIUM 9.1  --  9.3 9.3  PROT  --  7.7 6.4* 6.8  ALBUMIN  --  3.6 3.0* 3.3*  AST  --  101* 58* 71*  ALT  --  79* 54* 55*  ALKPHOS  --  112 108 108  BILITOT  --  0.9 0.9 1.4*  GFRNONAA 22*  --  22* 23*  ANIONGAP 13  --  9 7    Hematology Recent Labs  Lab 12/18/20 0434  WBC 4.5  RBC 3.56*  HGB 11.4*  HCT 35.5*  MCV 99.7  MCH 32.0  MCHC 32.1  RDW 14.3  PLT 121*    BNP Recent Labs  Lab 12/18/20 0537  BNP 1,945.1*    DDimer No results for input(s): DDIMER in the last 168 hours.   Radiology    DG Chest 1 View  Result Date: 12/18/2020 CLINICAL DATA:  Shortness of breath. EXAM: CHEST  1 VIEW COMPARISON:  None. FINDINGS: The patient has a moderately large right pleural effusion and airspace disease. The left lung is clear.  Cardiomegaly. Port-A-Cath is in place. IMPRESSION: Moderately large right pleural effusion and airspace disease which could be due to atelectasis or pneumonia.   Cardiac Studies   Echo 12/18/2020  1. Left ventricular ejection fraction, by estimation, is 50 to 55%. Left  ventricular ejection fraction by 3D volume is 53 %. The left ventricle has  low normal function. The left ventricle has no regional wall motion  abnormalities. Left ventricular  diastolic function could not be evaluated. There is the interventricular  septum is flattened in systole and diastole, consistent with right  ventricular pressure and volume overload.  2. Right ventricular systolic function is moderately reduced. The right  ventricular size is severely enlarged. There is moderately elevated  pulmonary artery systolic pressure. The estimated right ventricular  systolic pressure is [id number] mmHg.  3. Right atrial size was moderately dilated.  4. The mitral valve is degenerative. Mild mitral valve regurgitation. No  evidence of mitral stenosis. Moderate to severe mitral annular  calcification.  5. The tricuspid valve is  abnormal. Tricuspid valve regurgitation is  moderate.  6. The aortic valve is tricuspid. There is mild calcification of the  aortic valve. Aortic valve regurgitation is not visualized. Mild aortic  valve sclerosis is present, with no evidence of aortic valve stenosis.  7. Pulmonic valve regurgitation is moderate.  8. The inferior vena cava is normal in size with <50% respiratory  variability, suggesting right atrial pressure of 8 mmHg.    Patient Profile     This is a very complex 75 year old woman with numerous comorbidities, as detailed above. 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 She has previously refused device placement and other procedures, including repeat coronary angiography. She has been on amiodarone in addition to diltiazem and bisoprolol due to difficult rate control when in AF. All of these were stopped due to bradycardia. She is grossly hypervolemic and given her other comorbidies and her difficulty providing detailed history, it is unclear how much of her exertional symptoms are related to the bradycardia. Regardless, she does seem to have an ICD indication and Class I indication for beta blocker as well, so it seems reasonable to proceed with a permeant pacing system. I would expect she will have a high pacing burden and would probably warrant BiV pacing given her low EF.  Assessment & Plan    Paroxysmal atrial fibrillation with tachy-brady syndrome, persistent sinus bradycardia - Holding bb, ccb, amiodarone, previously on Diltiazem and bisoprolol, last dose on 12/16/2020 - she remains in Sinus bradycardia with ventricular rates now in low 50' - EP follows for consideration of a PM placement, potential complication with CKD stage 4 and increased risk of infection and with chemotherapy port in place.  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  - Daily weights, strict I&Os  Pulmonary hypertension - RVSP 60 mmHg, RV fluid and volume overload on echo Severe PR (per echo report):   Acute on chronic kidney disease: Baseline is unclear but she did have a more acute rise during her admission. I suspect this is cardiorenal. She is grossly decompensated. Will monitor closely with diuresis.   Cirrhosis: Etiology unclear but  would consider congestive hepatopathy as a potential etiology. I see she has had an EGD but unclear if she has varices. No history of HE. I don't think she actually has a contraindication to anticoagulation for her AF.   Large right pleural effusion, recurrent: Diuresis as above  Type 2 DM: most medication lists do not mention any anti-hyperglycemics. There is one mention of metformin, which was held. Will check A1c and start sliding scale insulin only for now.  For questions or updates, please contact [place] Please consult [email or web address] for contact info under     Signed, Ena Dawley, MD  12/20/2020, 11:59 AM

## 2020-12-20 NOTE — Progress Notes (Signed)
This RN attempted to wean patient to room air, as she endorses no using O2 at home. Patient was dozing and desat to 83%. 2L via [place] reapplied. Sat back up to 97%

## 2020-12-20 NOTE — Plan of Care (Signed)
  Problem: Education: Goal: Knowledge of General Education information will improve Description: Including pain rating scale, medication(s)/side effects and non-pharmacologic comfort measures Outcome: Progressing   Problem: Health Behavior/Discharge Planning: Goal: Ability to manage health-related needs will improve Outcome: Progressing   Problem: Clinical Measurements: Goal: Will remain free from infection Outcome: Progressing Goal: Diagnostic test results will improve Outcome: Progressing Goal: Respiratory complications will improve Outcome: Progressing Goal: Cardiovascular complication will be avoided Outcome: Progressing   Problem: Activity: Goal: Risk for activity intolerance will decrease Outcome: Progressing   Problem: Nutrition: Goal: Adequate nutrition will be maintained Outcome: Progressing   Problem: Coping: Goal: Level of anxiety will decrease Outcome: Progressing   Problem: Elimination: Goal: Will not experience complications related to bowel motility Outcome: Progressing Goal: Will not experience complications related to urinary retention Outcome: Progressing   Problem: Pain Managment: Goal: General experience of comfort will improve Outcome: Progressing   Problem: Safety: Goal: Ability to remain free from injury will improve Outcome: Progressing   Problem: Skin Integrity: Goal: Risk for impaired skin integrity will decrease Outcome: Progressing   Problem: Education: Goal: Ability to demonstrate management of disease process will improve Outcome: Progressing Goal: Ability to verbalize understanding of medication therapies will improve Outcome: Progressing Goal: Individualized Educational Video(s) Outcome: Progressing   Problem: Activity: Goal: Capacity to carry out activities will improve Outcome: Progressing   Problem: Clinical Measurements: Goal: Ability to maintain clinical measurements within normal limits will improve Outcome: Not  Progressing   Problem: Cardiac: Goal: Ability to achieve and maintain adequate cardiopulmonary perfusion will improve Outcome: Not Progressing  Medicinal management of bradycardia currently in process.

## 2020-12-20 NOTE — Progress Notes (Signed)
Hypoglycemic Event  CBG: 68 at 0342  Treatment:12.5 grams of Dextrose given IV due to patient npo   Symptoms: None  Follow-up CBG: Time:0416 CBG Result 134 Possible Reasons for Event: NPO for procedure  Star Age ,RN

## 2020-12-20 NOTE — Progress Notes (Addendum)
Progress Note  Patient Name: Felicia Ruiz Date of Encounter: 12/20/2020  [facility] Cardiologist: Dr. Chancy Milroy  Subjective   " I feel ok"  Inpatient Medications    Scheduled Meds: . Chlorhexidine Gluconate Cloth  6 each Topical Daily  . furosemide  40 mg Intravenous BID  . insulin aspart  0-9 Units Subcutaneous Q4H  . levothyroxine  112 mcg Oral QAC breakfast  . sodium chloride flush  3 mL Intravenous Q12H   Continuous Infusions: . sodium chloride     PRN Meds: sodium chloride, sodium chloride flush   Vital Signs    Vitals:   12/20/20 0004 12/20/20 0334 12/20/20 0521 12/20/20 0734  BP: (!) 132/54 134/64  (!) 131/57  Pulse: (!) 50 (!) 52  (!) 47  Resp: '17 14  19  '$ Temp: 97.7 F (36.5 C) 97.6 F (36.4 C)    TempSrc: Oral Oral    SpO2: 100% 100%  99%  Weight:   82.6 kg   Height:        Intake/Output Summary (Last 24 hours) at 12/20/2020 0744 Last data filed at 12/20/2020 0333 Gross per 24 hour  Intake 600 ml  Output 3600 ml  Net -3000 ml   Last 3 Weights 12/20/2020 12/19/2020 12/18/2020  Weight (lbs) 182 lb 1.6 oz 191 lb 2.2 oz 192 lb 0.3 oz  Weight (kg) 82.6 kg 86.7 kg 87.1 kg      Telemetry    SB 40's-low 50's - Personally Reviewed  ECG    No new EKGs - Personally Reviewed  Physical Exam   GEN: No acute distress.   Neck: No JVD Cardiac: RRR, bradycardic, no murmurs, rubs, or gallops.  Respiratory: decreased R, clear otherwise GI: Soft, nontender, non-distended  MS: No edema; No deformity. Neuro:  Nonfocal  Psych: Normal affect   Labs    High Sensitivity Troponin:  No results for input(s): TROPONINIHS in the last 720 hours.    Chemistry Recent Labs  Lab 12/18/20 0434 12/18/20 0537 12/19/20 0443 12/20/20 0055  NA 134*  --  135 135  K 3.8  --  3.3* 5.0  CL 94*  --  92* 89*  CO2 27  --  34* 39*  GLUCOSE 91  --  97 111*  BUN 67*  --  65* 64*  CREATININE 2.30*  --  2.31* 2.21*  CALCIUM 9.1  --  9.3 9.3  PROT  --  7.7 6.4* 6.8   ALBUMIN  --  3.6 3.0* 3.3*  AST  --  101* 58* 71*  ALT  --  79* 54* 55*  ALKPHOS  --  112 108 108  BILITOT  --  0.9 0.9 1.4*  GFRNONAA 22*  --  22* 23*  ANIONGAP 13  --  9 7     Hematology Recent Labs  Lab 12/18/20 0434  WBC 4.5  RBC 3.56*  HGB 11.4*  HCT 35.5*  MCV 99.7  MCH 32.0  MCHC 32.1  RDW 14.3  PLT 121*    BNP Recent Labs  Lab 12/18/20 0537  BNP 1,945.1*     DDimer No results for input(s): DDIMER in the last 168 hours.   Radiology    DG Chest 1 View Result Date: 12/18/2020 CLINICAL DATA:  Shortness of breath. EXAM: CHEST  1 VIEW COMPARISON:  None. FINDINGS: The patient has a moderately large right pleural effusion and airspace disease. The left lung is clear. Cardiomegaly. Port-A-Cath is in place. IMPRESSION: Moderately large right pleural effusion and airspace  disease which could be due to atelectasis or pneumonia. Electronically Signed   By: Inge Rise M.D.   On: 12/18/2020 12:45    Cardiac Studies   12/18/20: TTE IMPRESSIONS  1. Left ventricular ejection fraction, by estimation, is 50 to 55%. Left  ventricular ejection fraction by 3D volume is 53 %. The left ventricle has  low normal function. The left ventricle has no regional wall motion  abnormalities. Left ventricular  diastolic function could not be evaluated. There is the interventricular  septum is flattened in systole and diastole, consistent with right  ventricular pressure and volume overload.  2. Right ventricular systolic function is moderately reduced. The right  ventricular size is severely enlarged. There is moderately elevated  pulmonary artery systolic pressure. The estimated right ventricular  systolic pressure is [id number] mmHg.  3. Right atrial size was moderately dilated.  4. The mitral valve is degenerative. Mild mitral valve regurgitation. No  evidence of mitral stenosis. Moderate to severe mitral annular  calcification.  5. The tricuspid valve is abnormal. Tricuspid  valve regurgitation is  moderate.  6. The aortic valve is tricuspid. There is mild calcification of the  aortic valve. Aortic valve regurgitation is not visualized. Mild aortic  valve sclerosis is present, with no evidence of aortic valve stenosis.  7. Pulmonic valve regurgitation is moderate.  8. The inferior vena cava is normal in size with <50% respiratory  variability, suggesting right atrial pressure of 8 mmHg.   The day prior in [place] 12/17/20 LVEF reported 35%  Patient Profile     75 y.o. female with a hx of cirrhosis w/ascites, DM, HTN, HLD, AFib, hypothyroidism, AFib, and reports of chronic CHF (systolic), NICM, p.HTN, GERD, CKD (III),  Breast cancer (tx with chemo, port remains on left), admitted initially in [place] for volume overload and symptomatic bradycardia, transferred to [facility] for further management and device placement.  Assessment & Plan    1. Tachy-brady      Apparently longstanding and known for her      Historically had refused pacing/ICD implant      HR stable 40's- low 50's, BP stable      Stay off nodal blocking agents (off home meds starting 12/16/20, none 15th and 16th)        2. NICM 3. Chronic CHF  She is volume OL CXR with mod-large R effusion, this is a recurrent issue for her and historically required thoracentesis.   Conflicting EF reports  In review of cardiology consult by Dr. Hebert Soho who seems to know her well, seems she has what sounds a longstanding hx of NICM with LVEF 30's I discussed with Dr. Meda Coffee yesterday who read her echo here, and her EF here is accurate with LVEF 50-55%  I dont know if LBBB/IVCD is new  Dr. Lovena Le has seen and examined the patient this morning. Long discussion with her regarding access concerns and very high infection risk given her longstanding port and ongoing use of it when hospitalized. CXR was reviewed.  She is not unstable, BP is good, she has been amblating without symptoms. Given her  marked infection risk, access issues, Dr. Lovena Le for now will hold off on pacing. He will reach out to Dr. Chancy Milroy who knows the patient well and discuss plans For now, stay off amio and nodal blocking agents  OK to eat    4. Paroxysmal Afib     CHA2DS2Vasc is 6, she has declined a/c (carries hx of cirrhosis with some  GIB in the past as well)   Continue primary management/diuresis with attending cardiology team.  For questions or updates, please contact [place] Please consult [email or web address] for contact info under   Signed, Baldwin Jamaica, PA-C  12/20/2020, 7:44 AM    EP Attending  Patient seen and examined. Agree with above.  The patient poses a difficult constellation of problems. She has a h/o LV dysfunction but her EF here is normal. She has tachy-brady syndrome with PAF but has refused systemic anti-coagulation. She has a remote h/o breast CA and has been treated but her indwelling central catheter has never been removed. "They kept it in because I don't have good veins." Her breast CA was on the right side. Her HR has improved.  She has enough bradycardia and conduction system disease to recommend PPM. Unfortunately placement of a PM on the side of her port a cath will result in infection. Placement of the PPM on the right side is relatively contraindicated due to the prior breast CA and would still be at very high risk for infection as the indwelling catheter is located in the RA. I will discuss removal of the catheter with vascular surgery. Her h/o cirrhosis makes removal of a 75 yo port a cath risky.   [name] Rickelle Sylvestre,MD

## 2020-12-21 LAB — COMPREHENSIVE METABOLIC PANEL
ALT: 44 U/L (ref 0–44)
AST: 46 U/L — ABNORMAL HIGH (ref 15–41)
Albumin: 3 g/dL — ABNORMAL LOW (ref 3.5–5.0)
Alkaline Phosphatase: 101 U/L (ref 38–126)
Anion gap: 6 (ref 5–15)
BUN: 54 mg/dL — ABNORMAL HIGH (ref 8–23)
CO2: 43 mmol/L — ABNORMAL HIGH (ref 22–32)
Calcium: 8.9 mg/dL (ref 8.9–10.3)
Chloride: 87 mmol/L — ABNORMAL LOW (ref 98–111)
Creatinine, Ser: 1.75 mg/dL — ABNORMAL HIGH (ref 0.44–1.00)
GFR, Estimated: 30 mL/min — ABNORMAL LOW (ref >60)
Glucose, Bld: 94 mg/dL (ref 70–99)
Potassium: 2.9 mmol/L — ABNORMAL LOW (ref 3.5–5.1)
Sodium: 136 mmol/L (ref 135–145)
Total Bilirubin: 1.3 mg/dL — ABNORMAL HIGH (ref 0.3–1.2)
Total Protein: 6.5 g/dL (ref 6.5–8.1)

## 2020-12-21 LAB — GLUCOSE, CAPILLARY
Glucose-Capillary: 102 mg/dL — ABNORMAL HIGH (ref 70–99)
Glucose-Capillary: 113 mg/dL — ABNORMAL HIGH (ref 70–99)
Glucose-Capillary: 118 mg/dL — ABNORMAL HIGH (ref 70–99)
Glucose-Capillary: 120 mg/dL — ABNORMAL HIGH (ref 70–99)
Glucose-Capillary: 140 mg/dL — ABNORMAL HIGH (ref 70–99)
Glucose-Capillary: 154 mg/dL — ABNORMAL HIGH (ref 70–99)
Glucose-Capillary: 78 mg/dL (ref 70–99)

## 2020-12-21 MED ORDER — FUROSEMIDE 40 MG PO TABS
40.0000 mg | ORAL_TABLET | Freq: Every day | ORAL | Status: DC
  Administered 2020-12-22: 40 mg via ORAL
  Filled 2020-12-21: qty 1

## 2020-12-21 MED ORDER — POTASSIUM CHLORIDE CRYS ER 20 MEQ PO TBCR
20.0000 meq | EXTENDED_RELEASE_TABLET | Freq: Two times a day (BID) | ORAL | Status: DC
  Administered 2020-12-21 – 2020-12-22 (×3): 20 meq via ORAL
  Filled 2020-12-21 (×3): qty 1

## 2020-12-21 NOTE — Progress Notes (Signed)
Progress Note  Patient Name: Kiyo Tiffin Date of Encounter: 12/21/2020  Primary Cardiologist: No primary care provider on file.   Subjective   No chest pain or sob or syncope.   Inpatient Medications    Scheduled Meds: . Chlorhexidine Gluconate Cloth  6 each Topical Daily  . furosemide  40 mg Intravenous BID  . insulin aspart  0-9 Units Subcutaneous Q4H  . levothyroxine  112 mcg Oral QAC breakfast  . sodium chloride flush  3 mL Intravenous Q12H   Continuous Infusions: . sodium chloride     PRN Meds: sodium chloride, sodium chloride flush   Vital Signs    Vitals:   12/21/20 0008 12/21/20 0414 12/21/20 0421 12/21/20 0740  BP: 127/62 130/66  (!) 101/50  Pulse: (!) 53 (!) 53  (!) 53  Resp: '18 16  14  '$ Temp: 97.9 F (36.6 C) 98 F (36.7 C)  97.6 F (36.4 C)  TempSrc: Oral Oral  Oral  SpO2: 100% 98%  90%  Weight:   80.8 kg   Height:        Intake/Output Summary (Last 24 hours) at 12/21/2020 0858 Last data filed at 12/21/2020 0418 Gross per 24 hour  Intake 200 ml  Output 3450 ml  Net -3250 ml   Filed Weights   12/19/20 0325 12/20/20 0521 12/21/20 0421  Weight: 86.7 kg 82.6 kg 80.8 kg    Telemetry    Sinus bradycardia - Personally Reviewed  ECG    none - Personally Reviewed  Physical Exam   GEN: No acute distress.   Neck: No JVD Cardiac: Reg brady, no murmurs, rubs, or gallops.  Respiratory: Clear to auscultation bilaterally. GI: Soft, nontender, non-distended  MS: No edema; No deformity. Neuro:  Nonfocal  Psych: Normal affect   Labs    Chemistry Recent Labs  Lab 12/19/20 0443 12/20/20 0055 12/21/20 0455  NA 135 135 136  K 3.3* 5.0 2.9*  CL 92* 89* 87*  CO2 34* 39* 43*  GLUCOSE 97 111* 94  BUN 65* 64* 54*  CREATININE 2.31* 2.21* 1.75*  CALCIUM 9.3 9.3 8.9  PROT 6.4* 6.8 6.5  ALBUMIN 3.0* 3.3* 3.0*  AST 58* 71* 46*  ALT 54* 55* 44  ALKPHOS 108 108 101  BILITOT 0.9 1.4* 1.3*  GFRNONAA 22* 23* 30*  ANIONGAP '9 7 6      '$ Hematology Recent Labs  Lab 12/18/20 0434  WBC 4.5  RBC 3.56*  HGB 11.4*  HCT 35.5*  MCV 99.7  MCH 32.0  MCHC 32.1  RDW 14.3  PLT 121*    Cardiac EnzymesNo results for input(s): TROPONINI in the last 168 hours. No results for input(s): TROPIPOC in the last 168 hours.   BNP Recent Labs  Lab 12/18/20 0537  BNP 1,945.1*     DDimer No results for input(s): DDIMER in the last 168 hours.   Radiology    No results found.  Cardiac Studies   none  Patient Profile     75 y.o. female admitted with bradycardia and worsening renal failure, now improved off of sinus and AV nodal blocking drugs.   Assessment & Plan    1. Sinus node dysfunction - this appears to be better. Her HR's in the low 50's and high 40's. She is off of her beta blocker/calcium blocker. Amiodarone has been stopped and will be months before it is out of her system. 2. PAF - she is maintaining NSR 3. LV dysfunction - echo here demonstrates low normal LV  function. 4. Disp. - I have given her 2 options. One would be removal of her porta cath. I have discussed with Dr. Oneida Alar who is willing to remove. Despite it being in 12 years, he thinks it will come out with out too much difficulty although I have carefully explained to the patient that it could result in a tear of her vascular structures and lead to life threatening bleeding. Once her porta cath is out we could proceed with PPM insertion. Alternately we could try to control her atrial fib with very low dose amiodarone and leave in her porta cath. She is pondering and we will make a disposition tomorrow.  For questions or updates, please contact [place] Please consult [email or web address] for contact info under Cardiology/STEMI.   Signed, [name], MD  12/21/2020, 8:58 AM  Patient ID: Felicia Ruiz, female   DOB: 11-Mar-1946, 75 y.o.   MRN: [id number]

## 2020-12-21 NOTE — Plan of Care (Signed)

## 2020-12-22 LAB — BASIC METABOLIC PANEL
Anion gap: 10 (ref 5–15)
BUN: 52 mg/dL — ABNORMAL HIGH (ref 8–23)
CO2: 38 mmol/L — ABNORMAL HIGH (ref 22–32)
Calcium: 9 mg/dL (ref 8.9–10.3)
Chloride: 87 mmol/L — ABNORMAL LOW (ref 98–111)
Creatinine, Ser: 1.6 mg/dL — ABNORMAL HIGH (ref 0.44–1.00)
GFR, Estimated: 33 mL/min — ABNORMAL LOW (ref >60)
Glucose, Bld: 99 mg/dL (ref 70–99)
Potassium: 3.7 mmol/L (ref 3.5–5.1)
Sodium: 135 mmol/L (ref 135–145)

## 2020-12-22 LAB — GLUCOSE, CAPILLARY
Glucose-Capillary: 103 mg/dL — ABNORMAL HIGH (ref 70–99)
Glucose-Capillary: 127 mg/dL — ABNORMAL HIGH (ref 70–99)
Glucose-Capillary: 104 mg/dL — ABNORMAL HIGH (ref 70–99)

## 2020-12-22 MED ORDER — POTASSIUM CHLORIDE ER 10 MEQ PO CPCR: 10.0000 meq | ORAL_CAPSULE | Freq: Every day | ORAL | 3 refills | Status: DC

## 2020-12-22 MED ORDER — HEPARIN SOD (PORK) LOCK FLUSH 100 UNIT/ML IV SOLN: 500.0000 [IU] | INTRAVENOUS | Status: DC | PRN

## 2020-12-22 MED ORDER — METOLAZONE 2.5 MG PO TABS: 2.5000 mg | ORAL_TABLET | Freq: Every day | ORAL | 3 refills | Status: AC | PRN

## 2020-12-22 MED ORDER — AMIODARONE HCL 200 MG PO TABS: 100.0000 mg | ORAL_TABLET | Freq: Every day | ORAL | 3 refills | Status: DC

## 2020-12-22 NOTE — Progress Notes (Signed)
Patient has discharge order in, she pulled port a cath and dressing off, patient states "this always comes out when I leave the hospital, the only time it is used is when I'm at the hospital"--no bleeding, I have placed stat order for IV team to come to room to deaccess the port before patient is allowed to go home

## 2020-12-22 NOTE — Discharge Instructions (Signed)
WEIGH DAILY, every am, wearing the same amount of clothing Record weights, and bring it to office appointments Contact [name], MD for weight gain of 3 lbs in a day or 5 lbs in a week Limit sodium to 500 mg per meal, total 2000 mg per day Limit all liquids to 1.5-2 liters/quarts per day

## 2020-12-22 NOTE — Progress Notes (Signed)
Progress Note  Patient Name: Felicia Ruiz Date of Encounter: 12/22/2020  Primary Cardiologist: Ena Dawley, MD   Subjective   I had a long converstation with the patient about her PPM and removal of her Glori Luis a cath. She denies chest pain, sob, or syncope.  Inpatient Medications    Scheduled Meds: . Chlorhexidine Gluconate Cloth  6 each Topical Daily  . furosemide  40 mg Oral Daily  . insulin aspart  0-9 Units Subcutaneous Q4H  . levothyroxine  112 mcg Oral QAC breakfast  . potassium chloride  20 mEq Oral BID  . sodium chloride flush  3 mL Intravenous Q12H   Continuous Infusions: . sodium chloride     PRN Meds: sodium chloride, sodium chloride flush   Vital Signs    Vitals:   12/21/20 2306 12/22/20 0241 12/22/20 0405 12/22/20 0808  BP:  (!) 130/57  (!) 115/52  Pulse:  (!) 53  (!) 49  Resp: '20 20  20  '$ Temp:  98.1 F (36.7 C)  97.6 F (36.4 C)  TempSrc:  Oral  Oral  SpO2:  98%  97%  Weight:   80.6 kg   Height:        Intake/Output Summary (Last 24 hours) at 12/22/2020 1104 Last data filed at 12/22/2020 0900 Gross per 24 hour  Intake 240 ml  Output 1950 ml  Net -1710 ml   Filed Weights   12/20/20 0521 12/21/20 0421 12/22/20 0405  Weight: 82.6 kg 80.8 kg 80.6 kg    Telemetry    Sinus bradycardia - Personally Reviewed  ECG    none - Personally Reviewed  Physical Exam   GEN: No acute distress.   Neck: No JVD Cardiac: Reg brady, no murmurs, rubs, or gallops.  Respiratory: Clear to auscultation bilaterally. GI: Soft, nontender, non-distended  MS: No edema; No deformity. Neuro:  Nonfocal  Psych: Normal affect   Labs    Chemistry Recent Labs  Lab 12/19/20 0443 12/20/20 0055 12/21/20 0455 12/22/20 0212  NA 135 135 136 135  K 3.3* 5.0 2.9* 3.7  CL 92* 89* 87* 87*  CO2 34* 39* 43* 38*  GLUCOSE 97 111* 94 99  BUN 65* 64* 54* 52*  CREATININE 2.31* 2.21* 1.75* 1.60*  CALCIUM 9.3 9.3 8.9 9.0  PROT 6.4* 6.8 6.5  --   ALBUMIN 3.0*  3.3* 3.0*  --   AST 58* 71* 46*  --   ALT 54* 55* 44  --   ALKPHOS 108 108 101  --   BILITOT 0.9 1.4* 1.3*  --   GFRNONAA 22* 23* 30* 33*  ANIONGAP '9 7 6 10     '$ Hematology Recent Labs  Lab 12/18/20 0434  WBC 4.5  RBC 3.56*  HGB 11.4*  HCT 35.5*  MCV 99.7  MCH 32.0  MCHC 32.1  RDW 14.3  PLT 121*    Cardiac EnzymesNo results for input(s): TROPONINI in the last 168 hours. No results for input(s): TROPIPOC in the last 168 hours.   BNP Recent Labs  Lab 12/18/20 0537  BNP 1,945.1*     DDimer No results for input(s): DDIMER in the last 168 hours.   Radiology    No results found.  Cardiac Studies   none  Patient Profile     75 y.o. female admitted with sinus node dysfunction, PAF, and a h/o LV dysfunction and CHF  Assessment & Plan    1. Symptomatic sinus node dysfunction - at this time she prefers not to undergo  porta cath removal and PPM insertion. I will see her back in the office in 4-6 weeks in [place] for additional discussion. Hold all beta blockers or calcium channel blockers. 2. PAF - she has maintained NSR. I would send her home on amio 100 mg daily. 3. LV dysfunction - her EF in the hospital by echo was 50%. 4. Indwelling port o cath - I discussed removal of the port o cath with Dr. Oneida Alar who was willing. She refused.  Salome Spotted  For questions or updates, please contact [facility] Please consult [email or web address] for contact info under Cardiology/STEMI.      Signed, [name], MD  12/22/2020, 11:04 AM  Patient ID: Felicia Ruiz, female   DOB: [birth year], 75 y.o.   MRN: [id number]

## 2020-12-22 NOTE — Discharge Summary (Addendum)
Discharge Summary    Patient ID: Felicia Ruiz MRN: [id number]; DOB: 1946-04-16  Admit date: 12/18/2020 Discharge date: 12/22/2020  PCP:  Leone [place], [place]  Cardiologist:  Ena Dawley, MD new this admit Advanced Practice Provider:  No care team member to display Electrophysiologist:  [name], MD new this admit   Discharge Diagnoses    Active Problems:   Atrial fibrillation ([place])   HFrEF (heart failure with reduced ejection fraction) ([place])   Nonischemic cardiomyopathy ([place])   History of breast cancer   Acute on chronic kidney failure [facility])   Essential hypertension   Severe pulmonary valve regurgitation   Pleural effusion   Pulmonary hypertension ([place])   Cirrhosis of liver ([facility])   Hypothyroidism   First degree AV block   Tachycardia-bradycardia syndrome ([place])   Acute combined systolic and diastolic heart failure ([place])    Diagnostic Studies/Procedures    ECHO: 12/18/2020  1. Left ventricular ejection fraction, by estimation, is 50 to 55%. Left  ventricular ejection fraction by 3D volume is 53 %. The left ventricle has  low normal function. The left ventricle has no regional wall motion  abnormalities. Left ventricular diastolic function could not be evaluated. There is the interventricular septum is flattened in systole and diastole, consistent with right ventricular pressure and volume overload.   2. Right ventricular systolic function is moderately reduced. The right  ventricular size is severely enlarged. There is moderately elevated  pulmonary artery systolic pressure. The estimated right ventricular  systolic pressure is [id number] mmHg.   3. Right atrial size was moderately dilated.   4. The mitral valve is degenerative. Mild mitral valve regurgitation. No  evidence of mitral stenosis. Moderate to severe mitral annular  calcification.   5. The tricuspid valve is abnormal. Tricuspid valve regurgitation is   moderate.   6. The aortic valve is tricuspid. There is mild calcification of the  aortic valve. Aortic valve regurgitation is not visualized. Mild aortic  valve sclerosis is present, with no evidence of aortic valve stenosis.   7. Pulmonic valve regurgitation is moderate.   8. The inferior vena cava is normal in size with <50% respiratory  variability, suggesting right atrial pressure of 8 mmHg.  _____________   History of Present Illness     Felicia Ruiz is a 75 y.o. female with hx of chronic atrial fibrillation with tachy-brady syndrome, HFrEF due to NICM, pulmonary hypertension, cirrhosis, CKD III-IV, recurrent pleural effusion, HTN, type 2 DM, history of breast cancer with LND/chemo with port still in place, hypothyroidism, and chronic anemia.  She was admitted 03/16 with bradycardia and worsening renal failure.  Hospital Course     Consultants: Dr Rayann Heman, EP   She was initially admitted by the [facility] in [place] for bradycardia.  She was transferred to [facility] for admission and possible pacemaker.  She has a long history of atrial fibrillation, and was on bisoprolol 15 mg daily, Cardizem CD 120 mg daily, digoxin (previously stopped) and amiodarone at 200 mg daily prior to admission.  She was not anticoagulated due to history of cirrhosis.  She had been on multiple agents for rate control in the past.  She has had problems with bradycardia in the past due to medications.  She had also refused a pacemaker in the past.  On arrival to [place], her heart rate was in the 30s, sinus rhythm with first-degree AV block.  Heart rates in the 40s were felt pretty normal  for her.  All of her rate lowering medications were held.  Her EF was reported as 35% by echo at [facility].  In addition to the bradycardia, she was in heart failure.  Her echo from 3/15 at [facility] showed an EF of 35%, RV function decreased as well.  An echo was performed [facility],  results above.  Her EF was normal, but her RVSP was elevated at 58.  1.  Persistent atrial fibrillation with tachybradycardia syndrome Her beta-blocker, diltiazem and amiodarone were held.  Digoxin had been previously discontinued.  She had been on aspirin, but this was discontinued as it is inadequate for stroke prevention.  EP was consulted and she was seen by Dr. Rayann Heman.  As her EF had improved, no ICD was indicated.  After her medications were held, her heart rate gradually improved.  When she was seen by EP, her heart rate was in the 40s and low 50s.  Her blood pressure was stable and she was mentating well.  Urgent pacemaker was not felt needed.  She has enough bradycardia and conduction system disease to recommend pacemaker.  However, placing a pacemaker on the side of her Port-A-Cath was felt to result in infection.  Because of her history of right-sided breast cancer, pacemaker placement on the right side is relatively contraindicated.  Her history of cirrhosis makes removal of a 75 year old Port-A-Cath risky.    At this time, she is to resume amiodarone at 100 mg daily.  At this time, she does not wish to get the Port-A-Cath removed in order to get a pacemaker.  She is to continue to monitor her heart rate.  Hopefully, the amiodarone will help her maintain sinus rhythm.  At discharge, she was maintaining sinus rhythm with a heart rate in the high 40s and low 50s.  2.  Acute on chronic systolic CHF Significantly volume overloaded on admission.  She was diuresed with IV Lasix and metolazone.  Her weight decreased from a peak of 192 pounds down to 177 pounds.  As she was euvolemic, supplemental oxygen was no longer needed.  Prior to admission she was on Lasix 40 mg and Zaroxolyn 2.5 mg daily.  She is back on her home dose of Lasix, not currently on Zaroxolyn.  She is to monitor her weight and sodium intake.  Her low heart rate may have contributed to her excess volume.  Her EF in [place] was  35% by echocardiogram checked on 3/15.  On 3/16 in [place], her EF was 50%.  We do not have access to the images in [place], but the images in [place] were reviewed by several different cardiologists and they were all in agreement that her EF was low-normal.  She is to do daily weights and was given instructions on sodium and fluid restrictions.  3.  Indwelling Port-A-Cath Port-A-Cath was initially placed for chemotherapy for breast cancer.  It has been there for 12 years.  Dr. Oneida Alar feels that he could remove it, but at this time the patient is refusing this procedure.  This procedure is necessary for her to get a pacemaker.  On 3/20, she was seen by Dr. Lovena Le and all data were reviewed.  No further inpatient work-up is indicated and she is considered stable for discharge, to follow up as an outpatient in [place] with Dr. Lovena Le   Did the patient have an acute coronary syndrome (MI, NSTEMI, STEMI, etc) this admission?:  No  Did the patient have a percutaneous coronary intervention (stent / angioplasty)?:  No.     _____________  Discharge Vitals Blood pressure (!) 119/54, pulse (!) 48, temperature 98 F (36.7 C), temperature source Oral, resp. rate 18, height '5\' 2"'$  (1.575 m), weight 80.6 kg, SpO2 99 %.  Filed Weights   12/20/20 0521 12/21/20 0421 12/22/20 0405  Weight: 82.6 kg 80.8 kg 80.6 kg    Labs & Radiologic Studies    CBC CBC    Component Value Date/Time   WBC 4.5 12/18/2020 0434   RBC 3.56 (L) 12/18/2020 0434   HGB 11.4 (L) 12/18/2020 0434   HCT 35.5 (L) 12/18/2020 0434   PLT 121 (L) 12/18/2020 0434   MCV 99.7 12/18/2020 0434   MCH 32.0 12/18/2020 0434   MCHC 32.1 12/18/2020 0434   RDW 14.3 12/18/2020 0434   LYMPHSABS 0.8 12/18/2020 0434   MONOABS 0.5 12/18/2020 0434   EOSABS 0.1 12/18/2020 0434   BASOSABS 0.0 12/18/2020 123456    Basic Metabolic Panel Recent Labs    12/21/20 0455 12/22/20 0212  NA 136 135  K 2.9* 3.7   CL 87* 87*  CO2 43* 38*  GLUCOSE 94 99  BUN 54* 52*  CREATININE 1.75* 1.60*  CALCIUM 8.9 9.0   Liver Function Tests Recent Labs    12/20/20 0055 12/21/20 0455  AST 71* 46*  ALT 55* 44  ALKPHOS 108 101  BILITOT 1.4* 1.3*  PROT 6.8 6.5  ALBUMIN 3.3* 3.0*    BNP    Component Value Date/Time   BNP 1,945.1 (H) 12/18/2020 0537   Hemoglobin A1C Lab Results  Component Value Date   HGBA1C 6.6 (H) 12/18/2020   _____________  DG Chest 1 View  Result Date: 12/18/2020 CLINICAL DATA:  Shortness of breath. EXAM: CHEST  1 VIEW COMPARISON:  None. FINDINGS: The patient has a moderately large right pleural effusion and airspace disease. The left lung is clear. Cardiomegaly. Port-A-Cath is in place. IMPRESSION: Moderately large right pleural effusion and airspace disease which could be due to atelectasis or pneumonia. Electronically Signed   By: Inge Rise M.D.   On: 12/18/2020 12:45   ECHOCARDIOGRAM COMPLETE  Result Date: 12/18/2020    ECHOCARDIOGRAM REPORT   Patient Name:   [facility]ILIJA [facility] Date of Exam: 12/18/2020 Medical Rec #:  [id number]         Height:       62.0 in Accession #:    [id number]        Weight:       192.0 lb Date of Birth:  August 30, 1946        BSA:          1.879 m Patient Age:    54 years          BP:           99/59 mmHg Patient Gender: F                 HR:           49 bpm. Exam Location:  Inpatient Procedure: 2D Echo, 3D Echo, Cardiac Doppler and Color Doppler Indications:    CHF-Acute Systolic [id number]  History:        Patient has prior history of Echocardiogram examinations. Risk                 Factors:Hypertension and Diabetes. Chronic atrial fibrillation                 with tachy-brady  syndrome. Acute on chronic kidney disease.  Sonographer:    Darlina Sicilian RDCS Referring Phys: [id number] [place]  1. Left ventricular ejection fraction, by estimation, is 50 to 55%. Left ventricular ejection fraction by 3D volume is 53 %. The left ventricle has  low normal function. The left ventricle has no regional wall motion abnormalities. Left ventricular diastolic function could not be evaluated. There is the interventricular septum is flattened in systole and diastole, consistent with right ventricular pressure and volume overload.  2. Right ventricular systolic function is moderately reduced. The right ventricular size is severely enlarged. There is moderately elevated pulmonary artery systolic pressure. The estimated right ventricular systolic pressure is [id number] mmHg.  3. Right atrial size was moderately dilated.  4. The mitral valve is degenerative. Mild mitral valve regurgitation. No evidence of mitral stenosis. Moderate to severe mitral annular calcification.  5. The tricuspid valve is abnormal. Tricuspid valve regurgitation is moderate.  6. The aortic valve is tricuspid. There is mild calcification of the aortic valve. Aortic valve regurgitation is not visualized. Mild aortic valve sclerosis is present, with no evidence of aortic valve stenosis.  7. Pulmonic valve regurgitation is moderate.  8. The inferior vena cava is normal in size with <50% respiratory variability, suggesting right atrial pressure of 8 mmHg. FINDINGS  Left Ventricle: Left ventricular ejection fraction, by estimation, is 50 to 55%. Left ventricular ejection fraction by 3D volume is 53 %. The left ventricle has low normal function. The left ventricle has no regional wall motion abnormalities. The left ventricular internal cavity size was normal in size. There is no left ventricular hypertrophy. The interventricular septum is flattened in systole and diastole, consistent with right ventricular pressure and volume overload. Left ventricular diastolic function could not be evaluated due to mitral annular calcification (moderate or greater). Left ventricular diastolic function could not be evaluated. Right Ventricle: The right ventricular size is severely enlarged. No increase in right ventricular  wall thickness. Right ventricular systolic function is moderately reduced. There is moderately elevated pulmonary artery systolic pressure. The tricuspid regurgitant velocity is 3.56 m/s, and with an assumed right atrial pressure of 8 mmHg, the estimated right ventricular systolic pressure is [id number] mmHg. Left Atrium: Left atrial size was normal in size. Right Atrium: Right atrial size was moderately dilated. Pericardium: Trivial pericardial effusion is present. Mitral Valve: The mitral valve is degenerative in appearance. Moderate to severe mitral annular calcification. Mild mitral valve regurgitation. No evidence of mitral valve stenosis. Tricuspid Valve: The tricuspid valve is abnormal. Tricuspid valve regurgitation is moderate . No evidence of tricuspid stenosis. Aortic Valve: The aortic valve is tricuspid. There is mild calcification of the aortic valve. Aortic valve regurgitation is not visualized. Mild aortic valve sclerosis is present, with no evidence of aortic valve stenosis. Pulmonic Valve: The pulmonic valve was grossly normal. Pulmonic valve regurgitation is moderate. No evidence of pulmonic stenosis. Aorta: The aortic root is normal in size and structure. Venous: The inferior vena cava is normal in size with less than 50% respiratory variability, suggesting right atrial pressure of 8 mmHg. IAS/Shunts: The atrial septum is grossly normal.  LEFT VENTRICLE PLAX 2D LVIDd:         5.70 cm         Diastology LVIDs:         4.57 cm         LV e' medial:    3.00 cm/s LV PW:         1.00 cm  LV E/e' medial:  33.7 LV IVS:        0.87 cm         LV e' lateral:   4.43 cm/s LVOT diam:     2.10 cm         LV E/e' lateral: 22.8 LV SV:         79 LV SV Index:   42 LVOT Area:     3.46 cm        3D Volume EF                                LV 3D EF:    Left                                             ventricular LV Volumes (MOD)                            ejection LV vol d, MOD    137.0 ml                    fraction by A2C:                                        3D volume LV vol d, MOD    125.0 ml                   is 53 %. A4C: LV vol s, MOD    59.5 ml A2C:                           3D Volume EF: LV vol s, MOD    55.7 ml       3D EF:        53 % A4C:                           LV EDV:       154 ml LV SV MOD A2C:   77.5 ml       LV ESV:       73 ml LV SV MOD A4C:   125.0 ml      LV SV:        81 ml LV SV MOD BP:    75.7 ml RIGHT VENTRICLE RV S prime:     7.73 cm/s TAPSE (M-mode): 1.0 cm LEFT ATRIUM             Index       RIGHT ATRIUM           Index LA diam:        4.45 cm 2.37 cm/m  RA Area:     18.60 cm LA Vol (A2C):   54.4 ml 28.95 ml/m RA Volume:   52.10 ml  27.73 ml/m LA Vol (A4C):   52.7 ml 28.05 ml/m LA Biplane Vol: 56.6 ml 30.12 ml/m  AORTIC VALVE LVOT Vmax:   108.00 cm/s LVOT Vmean:  65.200 cm/s LVOT VTI:    0.229 m  AORTA Ao Root diam: 3.00 cm MITRAL VALVE  TRICUSPID VALVE MV Area (PHT): 3.93 cm     TR Peak grad:   50.7 mmHg MV Decel Time: 193 msec     TR Vmax:        356.00 cm/s MV E velocity: 101.00 cm/s MV A velocity: 50.20 cm/s   SHUNTS MV E/A ratio:  2.01         Systemic VTI:  0.23 m                             Systemic Diam: 2.10 cm Eleonore Chiquito MD Electronically signed by Eleonore Chiquito MD Signature Date/Time: 12/18/2020/3:32:55 PM    Final    Disposition   Pt is being discharged home today in improved condition.  Follow-up Plans & Appointments     Follow-up Information     Evans Lance, MD Follow up.   Specialty: Cardiology Why: The office will call.  Contact information: [place] [address] [phone]                Discharge Instructions     (HEART FAILURE PATIENTS) Call MD:  Anytime you have any of the following symptoms: 1) 3 pound weight gain in 24 hours or 5 pounds in 1 week 2) shortness of breath, with or without a dry hacking cough 3) swelling in the hands, feet or stomach 4) if you have to sleep on extra pillows at night in  order to breathe.   Complete by: As directed    Diet - low sodium heart healthy   Complete by: As directed    Diet Carb Modified   Complete by: As directed    Increase activity slowly   Complete by: As directed        Discharge Medications   Allergies as of 12/22/2020       Reactions   Levofloxacin    UNK reaction        Medication List     STOP taking these medications    aspirin EC 81 MG tablet   bisoprolol 10 MG tablet Commonly known as: ZEBETA   diltiazem 120 MG 24 hr capsule Commonly known as: CARDIZEM CD       TAKE these medications    allopurinol 100 MG tablet Commonly known as: ZYLOPRIM Take 100 mg by mouth daily.   amiodarone 200 MG tablet Commonly known as: PACERONE Take 0.5 tablets (100 mg total) by mouth daily. What changed: how much to take   furosemide 40 MG tablet Commonly known as: LASIX Take 40 mg by mouth daily.   levothyroxine 112 MCG tablet Commonly known as: SYNTHROID Take 112 mcg by mouth daily before breakfast.   MAGNESIUM OXIDE PO Take 250 mg by mouth in the morning, at noon, and at bedtime.   metolazone 2.5 MG tablet Commonly known as: ZAROXOLYN Take 1 tablet (2.5 mg total) by mouth daily as needed (For weight gain of 3 pounds in a day or 5 pounds in a week.). What changed:  when to take this reasons to take this   potassium chloride 10 MEQ CR capsule Commonly known as: MICRO-K Take 1 capsule (10 mEq total) by mouth daily. Take an extra 20 mEq on days you take the metolazone/Zaroxolyn What changed:  when to take this additional instructions           Outstanding Labs/Studies   None  Duration of Discharge Encounter   Greater than 30 minutes including physician  time.  Jonetta Speak, PA-C 12/22/2020, 11:39 AM  EP Attending  Patient seen and examined. Agree with above. See my note as well. She has refused to have her Porta cath removed and refused PPM insertion. I will see her back in the office  in 4-6 weeks.   [name] Coley Littles,MD

## 2020-12-31 ENCOUNTER — Ambulatory Visit: Payer: Medicare HMO | Admitting: Internal Medicine

## 2021-01-21 ENCOUNTER — Other Ambulatory Visit: Payer: Self-pay

## 2021-01-21 ENCOUNTER — Encounter: Payer: Self-pay | Admitting: Internal Medicine

## 2021-01-21 ENCOUNTER — Ambulatory Visit: Payer: Medicare HMO | Admitting: Internal Medicine

## 2021-01-21 VITALS — BP 132/70 | HR 57 | Ht 63.0 in | Wt 172.0 lb

## 2021-01-21 DIAGNOSIS — I480 Paroxysmal atrial fibrillation: Secondary | ICD-10-CM | POA: Diagnosis not present

## 2021-01-21 NOTE — Progress Notes (Signed)
HPI Ms. Filyaw returns today for followup. She is a pleasant 75 yo woman with a h/o tachy/brady syndrome. She was admitted to the hospital a couple of months ago with severe sinus node dysfunction. We held her many AV node blocking drugs and left her on low dose amiodarone. She has an indwelling port a cath which has been in placed for 12 years. I recommended removal but she declined. She has not had chest pain or sob. She has class 2 dyspnea. She denies edema.  Allergies  Allergen Reactions  . Levofloxacin     UNK reaction     Current Outpatient Medications  Medication Sig Dispense Refill  . allopurinol (ZYLOPRIM) 100 MG tablet Take 100 mg by mouth daily.    Marland Kitchen amiodarone (PACERONE) 200 MG tablet Take 0.5 tablets (100 mg total) by mouth daily. 30 tablet 3  . furosemide (LASIX) 40 MG tablet Take 40 mg by mouth daily.    . isosorbide dinitrate (ISORDIL) 10 MG tablet Take 10 mg by mouth 2 (two) times daily.    Marland Kitchen levothyroxine (SYNTHROID) 112 MCG tablet Take 112 mcg by mouth daily before breakfast.    . MAGNESIUM OXIDE PO Take 250 mg by mouth in the morning, at noon, and at bedtime.    . metolazone (ZAROXOLYN) 2.5 MG tablet Take 1 tablet (2.5 mg total) by mouth daily as needed (For weight gain of 3 pounds in a day or 5 pounds in a week.). 30 tablet 3  . potassium chloride (MICRO-K) 10 MEQ CR capsule Take 1 capsule (10 mEq total) by mouth daily. Take an extra 20 mEq on days you take the metolazone/Zaroxolyn 40 capsule 3   No current facility-administered medications for this visit.     Past Medical History:  Diagnosis Date  . Atrial fibrillation ([place]) 12/18/2020  . Chronic kidney disease (CKD) 12/18/2020  . 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 Essential hypertension 12/18/2020  . HFrEF (heart failure with reduced ejection fraction) ([place]) 12/18/2020  . History of breast cancer 12/18/2020  . Nonischemic cardiomyopathy ([place]) 12/18/2020  . Pulmonary hypertension ([place]) 12/18/2020     ROS:   All systems reviewed and negative except as noted in the HPI.      No family history on file.   Social History   Socioeconomic History  . Marital status: Widowed    Spouse name: Not on file  . Number of children: Not on file  . Years of education: Not on file  . Highest education level: Not on file  Occupational History  . Not on file  Tobacco Use  . Smoking status: Never Smoker  . Smokeless tobacco: Never Used  Substance and Sexual Activity  . Alcohol use: Not on file  . Drug use: Not on file  . Sexual activity: Not on file  Other Topics Concern  . Not on file  Social History Narrative  . Not on file   Social Determinants of Health   Financial Resource Strain: Not on file  Food Insecurity: Not on file  Transportation Needs: Not on file  Physical Activity: Not on file  Stress: Not on file  Social Connections: Not on file  Intimate Partner Violence: Not on file     BP 132/70   Pulse (!) 57   Ht '5\' 3"'$  (1.6 m)   Wt 172 lb (78 kg)   BMI 30.47 kg/m   Physical Exam:  Chronically ill appearing NAD HEENT: Unremarkable Neck:  No JVD, no  thyromegally Lymphatics:  No adenopathy Back:  No CVA tenderness Lungs:  scatterd rales and rhonchi but no increased work of breathing. HEART:  Regular rate rhythm, no murmurs, no rubs, no clicks Abd:  soft, positive bowel sounds, no organomegally, no rebound, no guarding Ext:  2 plus pulses, no edema, no cyanosis, no clubbing Skin:  No rashes no nodules Neuro:  CN II through XII intact, motor grossly intact  EKG - sinus brady with PAC's and IVCD  Assess/Plan: 1. Sinus node dysfunction - she has improved with medication adjustment. Continue low dose amiodarone. 2. PAF - she appears to be maintaining NSR. 3. LV dysfunction - her EF normalized at her last echo. She will continue  4. Obesity - she is encouraged to lose weight.   [name] Zarin Hagmann,MD

## 2021-01-21 NOTE — Patient Instructions (Signed)
Medication Instructions:  Your physician recommends that you continue on your current medications as directed. Please refer to the Current Medication list given to you today.  *If you need a refill on your cardiac medications before your next appointment, please call your pharmacy*   Lab Work: None today If you have labs (blood work) drawn today and your tests are completely normal, you will receive your results only by: Marland Kitchen MyChart Message (if you have MyChart) OR . A paper copy in the mail If you have any lab test that is abnormal or we need to change your treatment, we will call you to review the results.   Testing/Procedures: None today   Follow-Up: At [facility], you and your health needs are our priority.  As part of our continuing mission to provide you with exceptional heart care, we have created designated Provider Care Teams.  These Care Teams include your primary Cardiologist (physician) and Advanced Practice Providers (APPs -  Physician Assistants and Nurse Practitioners) who all work together to provide you with the care you need, when you need it.  We recommend signing up for the patient portal called "MyChart".  Sign up information is provided on this After Visit Summary.  MyChart is used to connect with patients for Virtual Visits (Telemedicine).  Patients are able to view lab/test results, encounter notes, upcoming appointments, etc.  Non-urgent messages can be sent to your provider as well.   To learn more about what you can do with MyChart, go to [email or web address].    Your next appointment:   6 month(s)  The format for your next appointment:   In Person  Provider:   [name], MD   Other Instructions None

## 2021-08-14 ENCOUNTER — Ambulatory Visit: Payer: Medicare HMO | Admitting: Internal Medicine

## 2021-12-16 ENCOUNTER — Ambulatory Visit: Payer: Medicare HMO | Admitting: Internal Medicine

## 2021-12-16 ENCOUNTER — Encounter: Payer: Self-pay | Admitting: Internal Medicine

## 2021-12-16 VITALS — BP 140/80 | HR 114 | Ht 62.0 in | Wt 209.0 lb

## 2021-12-16 DIAGNOSIS — I480 Paroxysmal atrial fibrillation: Secondary | ICD-10-CM

## 2021-12-16 DIAGNOSIS — I495 Sick sinus syndrome: Secondary | ICD-10-CM | POA: Diagnosis not present

## 2021-12-16 NOTE — Addendum Note (Signed)
Addended by: Barbarann Ehlers A on: 12/16/2021 04:40 PM ? ? Modules accepted: Orders ? ?

## 2021-12-16 NOTE — Progress Notes (Signed)
? ? ? ? ?HPI ?Felicia Ruiz returns today for followup. She is a pleasant 76 yo woman with a h/o tachy/brady syndrome. She was admitted to the hospital a couple of months ago with severe sinus node dysfunction. We held her many AV node blocking drugs and left her on low dose amiodarone. She has an indwelling port a cath which has been in placed for 14 years. I recommended removal but she declined. She has not had chest pain. She has class 2 dyspnea. She denies edema.  Since her last visit, she has felt poorly in the past few months. She does not have palpitations. More sob.  ?Allergies  ?Allergen Reactions  ? Levofloxacin   ?  UNK reaction  ? ? ? ?Current Outpatient Medications  ?Medication Sig Dispense Refill  ? allopurinol (ZYLOPRIM) 100 MG tablet Take 100 mg by mouth daily.    ? amiodarone (PACERONE) 200 MG tablet Take 0.5 tablets (100 mg total) by mouth daily. 30 tablet 3  ? furosemide (LASIX) 40 MG tablet Take 20 mg by mouth every other day.    ? isosorbide dinitrate (ISORDIL) 10 MG tablet Take 10 mg by mouth 2 (two) times daily.    ? levothyroxine (SYNTHROID) 112 MCG tablet Take 112 mcg by mouth daily before breakfast.    ? LINZESS 145 MCG CAPS capsule Take 145 mcg by mouth every other day.    ? MAGNESIUM OXIDE PO Take 250 mg by mouth in the morning, at noon, and at bedtime.    ? metolazone (ZAROXOLYN) 2.5 MG tablet Take 1 tablet (2.5 mg total) by mouth daily as needed (For weight gain of 3 pounds in a day or 5 pounds in a week.). 30 tablet 3  ? potassium chloride (MICRO-K) 10 MEQ CR capsule Take 1 capsule (10 mEq total) by mouth daily. Take an extra 20 mEq on days you take the metolazone/Zaroxolyn 40 capsule 3  ? ?No current facility-administered medications for this visit.  ? ? ? ?Past Medical History:  ?Diagnosis Date  ? Atrial fibrillation ([place]) 12/18/2020  ? Chronic kidney disease (CKD) 12/18/2020  ? 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 Essential hypertension 12/18/2020  ? HFrEF (heart failure with reduced  ejection fraction) ([place]) 12/18/2020  ? History of breast cancer 12/18/2020  ? Nonischemic cardiomyopathy ([place]) 12/18/2020  ? Pulmonary hypertension ([place]) 12/18/2020  ? ? ?ROS: ? ? All systems reviewed and negative except as noted in the HPI. ? ? ?Past Surgical History:  ?Procedure Laterality Date  ? BREAST LUMPECTOMY Right   ? ? ? ?Family History  ?Problem Relation Age of Onset  ? Hypertension Mother   ? Hypertension Father   ? ? ? ?Social History  ? ?Socioeconomic History  ? Marital status: Widowed  ?  Spouse name: Not on file  ? Number of children: Not on file  ? Years of education: Not on file  ? Highest education level: Not on file  ?Occupational History  ? Not on file  ?Tobacco Use  ? Smoking status: Never  ? Smokeless tobacco: Never  ?Vaping Use  ? Vaping Use: Never used  ?Substance and Sexual Activity  ? Alcohol use: Never  ? Drug use: Never  ? Sexual activity: Not on file  ?Other Topics Concern  ? Not on file  ?Social History Narrative  ? Not on file  ? ?Social Determinants of Health  ? ?Financial Resource Strain: Not on file  ?Food Insecurity: Not on file  ?Transportation Needs: Not on file  ?  Physical Activity: Not on file  ?Stress: Not on file  ?Social Connections: Not on file  ?Intimate Partner Violence: Not on file  ? ? ? ?BP 140/80   Pulse (!) 114   Ht 5\' 2"  (1.575 m)   Wt 209 lb (94.8 kg)   SpO2 98%   BMI 38.23 kg/m?  ? ?Physical Exam: ? ?obese appearing woman, NAD ?HEENT: Unremarkable ?Neck:  No JVD, no thyromegally ?Lymphatics:  No adenopathy ?Back:  No CVA tenderness ?Lungs:  Clear with no wheezes; port o cath in place in the left IJ ?HEART:  Regular rate rhythm, no murmurs, no rubs, no clicks ?Abd:  soft, positive bowel sounds, no organomegally, no rebound, no guarding ?Ext:  2 plus pulses, no edema, no cyanosis, no clubbing ?Skin:  No rashes no nodules ?Neuro:  CN II through XII intact, motor grossly intact ? ?EKG - atrial flutter with 2:1 AV conduction ? ?Assess/Plan:  ?1. Sinus node dysfunction  - as she has had more RVR, and has known sinus node dysfunction, I have recommended we proceed with PPM. She will need her port o cath removed before hand. We will do both at the same procedure. ?2. PAF - she is now in atrial flutter with a RVR on low dose amiodarone. She has felt poorly. Her HR has been up. Because she has had atrial fib, and is not a good candidate for ablation due to her increased  bmi, I would recommend uptitration of the amio once she has a ppm for back up. ?3. LV dysfunction - her EF normalized at her last echo. She will continue  ?4. Obesity - she is encouraged to lose weight.  ?  ?Felicia Overlie Rashae Rother,MD ?

## 2021-12-16 NOTE — Patient Instructions (Signed)
Medication Instructions:  ?Your physician recommends that you continue on your current medications as directed. Please refer to the Current Medication list given to you today. ? ?*If you need a refill on your cardiac medications before your next appointment, please call your pharmacy* ? ? ?Lab Work: ?Your physician recommends that you return for lab work in: [place], BMET ? ?If you have labs (blood work) drawn today and your tests are completely normal, you will receive your results only by: ?MyChart Message (if you have MyChart) OR ?A paper copy in the mail ?If you have any lab test that is abnormal or we need to change your treatment, we will call you to review the results. ? ? ?Testing/Procedures: ?Your physician has recommended that you have a pacemaker inserted. A pacemaker is a small device that is placed under the skin of your chest or abdomen to help control abnormal heart rhythms. This device uses electrical pulses to prompt the heart to beat at a normal rate. Pacemakers are used to treat heart rhythms that are too slow. Wire (leads) are attached to the pacemaker that goes into the chambers of you heart. This is done in the hospital and usually requires and overnight stay. Please see the instruction sheet given to you today for more information. ? ? ? ?Follow-Up: ?At [facility], you and your health needs are our priority.  As part of our continuing mission to provide you with exceptional heart care, we have created designated Provider Care Teams.  These Care Teams include your primary Cardiologist (physician) and Advanced Practice Providers (APPs -  Physician Assistants and Nurse Practitioners) who all work together to provide you with the care you need, when you need it. ? ?We recommend signing up for the patient portal called "MyChart".  Sign up information is provided on this After Visit Summary.  MyChart is used to connect with patients for Virtual Visits (Telemedicine).  Patients are able to view  lab/test results, encounter notes, upcoming appointments, etc.  Non-urgent messages can be sent to your provider as well.   ?To learn more about what you can do with MyChart, go to [email or web address].   ? ?Your next appointment:   ? 91 Days after Pacemaker  ? ?The format for your next appointment:   ?In Person ? ?Provider:   ?[name], MD  ? ? ?Other Instructions ?Thank you for choosing [place]! ?  ? ?

## 2022-01-19 ENCOUNTER — Telehealth: Payer: Self-pay | Admitting: *Deleted

## 2022-01-19 NOTE — Telephone Encounter (Signed)
Called pt to give update on pacemaker and port removal. No answer, no voicemail.  ?

## 2022-03-11 IMAGING — DX DG CHEST 1V
1 series · 1 of 1 positions shown · non-contrast
Comparison: None.

CLINICAL DATA: Shortness of breath.

EXAM:
CHEST  1 VIEW

[chest]
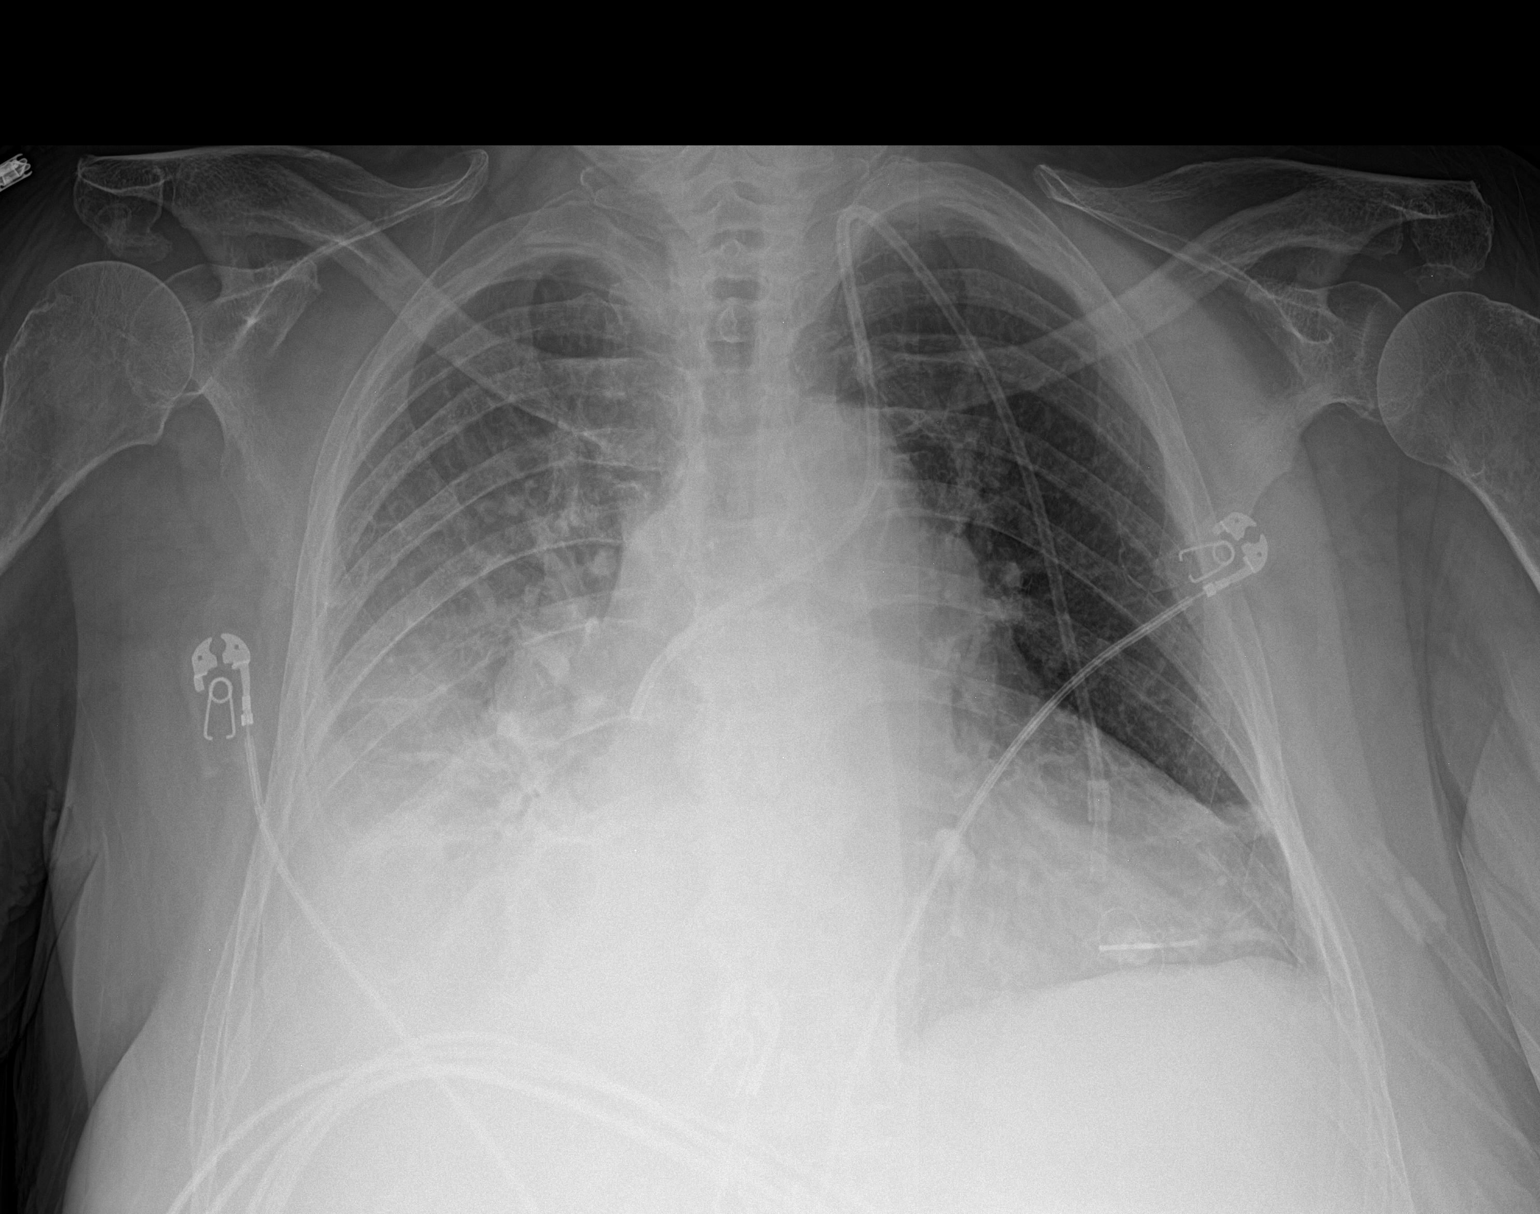

[1 of 1 positions shown; findings below may reference images not displayed]

FINDINGS: The patient has a moderately large right pleural effusion and
airspace disease. The left lung is clear. Cardiomegaly. Port-A-Cath
is in place.
IMPRESSION: Moderately large right pleural effusion and airspace disease which
could be due to atelectasis or pneumonia.

## 2022-03-17 ENCOUNTER — Telehealth: Payer: Self-pay

## 2022-03-17 ENCOUNTER — Other Ambulatory Visit: Payer: Self-pay | Admitting: *Deleted

## 2022-03-17 DIAGNOSIS — R0602 Shortness of breath: Secondary | ICD-10-CM

## 2022-03-17 DIAGNOSIS — I5020 Unspecified systolic (congestive) heart failure: Secondary | ICD-10-CM

## 2022-03-17 NOTE — Telephone Encounter (Signed)
Outreach made to Pt.  Pt scheduled for pacemaker implant/port removal April 13, 2022.  Pt states she is having issues with fluid retention.    Will discuss with GT

## 2022-03-17 NOTE — Progress Notes (Signed)
Order placed for CBC, CMET and chest x-ray. Pt notified and states that she will have done on tomorrow.

## 2022-03-17 NOTE — Telephone Encounter (Signed)
Discussed with Dr. Lovena Le.  He is requesting CMP, CBC and chest xray to evaluate fluid retention and SOB.  Will contact Pt to see if she can get testing at [facility] tomorrow.  Continue to monitor.

## 2022-03-18 ENCOUNTER — Encounter (HOSPITAL_COMMUNITY): Payer: Self-pay | Admitting: Radiology

## 2022-03-18 ENCOUNTER — Ambulatory Visit (HOSPITAL_COMMUNITY)
Admission: RE | Admit: 2022-03-18 | Discharge: 2022-03-18 | Disposition: A | Discharge status: Home / Self Care | Payer: Medicare HMO | Source: Ambulatory Visit | Attending: Internal Medicine | Admitting: Internal Medicine

## 2022-03-18 ENCOUNTER — Other Ambulatory Visit (HOSPITAL_COMMUNITY)
Admission: RE | Admit: 2022-03-18 | Discharge: 2022-03-18 | Disposition: A | Discharge status: Home / Self Care | Payer: Medicare HMO | Source: Ambulatory Visit | Attending: Internal Medicine | Admitting: Internal Medicine

## 2022-03-18 DIAGNOSIS — I495 Sick sinus syndrome: Secondary | ICD-10-CM | POA: Insufficient documentation

## 2022-03-18 DIAGNOSIS — I480 Paroxysmal atrial fibrillation: Secondary | ICD-10-CM

## 2022-03-18 DIAGNOSIS — R0602 Shortness of breath: Secondary | ICD-10-CM | POA: Diagnosis present

## 2022-03-18 DIAGNOSIS — I5020 Unspecified systolic (congestive) heart failure: Secondary | ICD-10-CM | POA: Diagnosis present

## 2022-03-18 LAB — CBC
HCT: 34 % — ABNORMAL LOW (ref 36.0–46.0)
Hemoglobin: 10.6 g/dL — ABNORMAL LOW (ref 12.0–15.0)
MCH: 31.3 pg (ref 26.0–34.0)
MCHC: 31.2 g/dL (ref 30.0–36.0)
MCV: 100.3 fL — ABNORMAL HIGH (ref 80.0–100.0)
Platelets: 172 10*3/uL (ref 150–400)
RBC: 3.39 MIL/uL — ABNORMAL LOW (ref 3.87–5.11)
RDW: 14.6 % (ref 11.5–15.5)
WBC: 6.3 10*3/uL (ref 4.0–10.5)
nRBC: 0 % (ref 0.0–0.2)

## 2022-03-18 LAB — BASIC METABOLIC PANEL
Anion gap: 8 (ref 5–15)
BUN: 31 mg/dL — ABNORMAL HIGH (ref 8–23)
CO2: 26 mmol/L (ref 22–32)
Calcium: 9.1 mg/dL (ref 8.9–10.3)
Chloride: 101 mmol/L (ref 98–111)
Creatinine, Ser: 1.7 mg/dL — ABNORMAL HIGH (ref 0.44–1.00)
GFR, Estimated: 31 mL/min — ABNORMAL LOW (ref >60)
Glucose, Bld: 116 mg/dL — ABNORMAL HIGH (ref 70–99)
Potassium: 3.9 mmol/L (ref 3.5–5.1)
Sodium: 135 mmol/L (ref 135–145)

## 2022-03-20 NOTE — Progress Notes (Signed)
Per Dr. Lovena Le pt is to take Lasix daily for 3 days then return to current dosing.  Pt notified and reminded of upcoming appt and procedure. Pt voiced understanding.

## 2022-03-25 NOTE — Telephone Encounter (Signed)
Work up complete. 

## 2022-04-01 ENCOUNTER — Encounter: Payer: Self-pay | Admitting: Internal Medicine

## 2022-04-01 ENCOUNTER — Ambulatory Visit: Payer: Medicare HMO | Admitting: Internal Medicine

## 2022-04-01 ENCOUNTER — Encounter: Payer: Self-pay | Admitting: *Deleted

## 2022-04-01 VITALS — BP 120/78 | HR 95 | Ht 63.0 in | Wt 226.3 lb

## 2022-04-01 DIAGNOSIS — I480 Paroxysmal atrial fibrillation: Secondary | ICD-10-CM

## 2022-04-01 NOTE — H&P (View-Only) (Signed)
HPI Felicia Ruiz returns today for followup. She is a pleasant 76 yo woman with a h/o tachy/brady syndrome. She was admitted to the hospital a couple of months ago with severe sinus node dysfunction. We held her many AV node blocking drugs and left her on low dose amiodarone. She has an indwelling port a cath which has been in placed for 14 years. I recommended removal but she declined. She has not had chest pain. She has class 3 dyspnea and renal insufficiency. She denies edema.  Since her last visit, she has felt poorly in the past few months. She does not have palpitations. More sob and swelling. She was seen in the [place] ED with atrial fib/flutter and volume overload and treated with IV diuretic therapy. Allergies  Allergen Reactions   Levofloxacin     UNK reaction   Statins     Don't want to take them d/t hearing bad things about statins     Current Outpatient Medications  Medication Sig Dispense Refill   allopurinol (ZYLOPRIM) 100 MG tablet Take 100 mg by mouth daily.     amiodarone (PACERONE) 200 MG tablet Take 0.5 tablets (100 mg total) by mouth daily. 30 tablet 3   furosemide (LASIX) 40 MG tablet Take 20 mg by mouth every other day.     isosorbide dinitrate (ISORDIL) 10 MG tablet Take 10 mg by mouth 2 (two) times daily.     levothyroxine (SYNTHROID) 112 MCG tablet Take by mouth.     MAGNESIUM OXIDE PO Take 250 mg by mouth in the morning, at noon, and at bedtime.     metolazone (ZAROXOLYN) 2.5 MG tablet Take 1 tablet (2.5 mg total) by mouth daily as needed (For weight gain of 3 pounds in a day or 5 pounds in a week.). 30 tablet 3   potassium chloride (MICRO-K) 10 MEQ CR capsule Take 1 capsule (10 mEq total) by mouth daily. Take an extra 20 mEq on days you take the metolazone/Zaroxolyn 40 capsule 3   No current facility-administered medications for this visit.     Past Medical History:  Diagnosis Date   Atrial fibrillation ([place]) 12/18/2020   Chronic kidney disease  (CKD) 12/18/2020   Cirrhosis of liver ([place]) 12/18/2020   Essential hypertension 12/18/2020   HFrEF (heart failure with reduced ejection fraction) ([place]) 12/18/2020   History of breast cancer 12/18/2020   Nonischemic cardiomyopathy ([place]) 12/18/2020   Pulmonary hypertension ([place]) 12/18/2020    ROS:   All systems reviewed and negative except as noted in the HPI.   Past Surgical History:  Procedure Laterality Date   BREAST LUMPECTOMY Right    PORTA CATH INSERTION Right 2008     Family History  Problem Relation Age of Onset   Hypertension Mother    Hypertension Father      Social History   Socioeconomic History   Marital status: Widowed    Spouse name: Not on file   Number of children: Not on file   Years of education: Not on file   Highest education level: Not on file  Occupational History   Not on file  Tobacco Use   Smoking status: Never   Smokeless tobacco: Never  Vaping Use   Vaping Use: Never used  Substance and Sexual Activity   Alcohol use: Never   Drug use: Never   Sexual activity: Not on file  Other Topics Concern   Not on file  Social History Narrative   Not on file  Social Determinants of Health   Financial Resource Strain: Not on file  Food Insecurity: Not on file  Transportation Needs: Not on file  Physical Activity: Not on file  Stress: Not on file  Social Connections: Not on file  Intimate Partner Violence: Not on file     BP 120/78   Pulse 95   Ht 5\' 3"  (1.6 m)   Wt 226 lb 4.8 oz (102.6 kg)   SpO2 96%   BMI 40.09 kg/m   Physical Exam:  Well appearing NAD HEENT: Unremarkable Neck:  No JVD, no thyromegally Lymphatics:  No adenopathy Back:  No CVA tenderness Lungs:  Clear with basilar rales. HEART:  Regular rate rhythm, no murmurs, no rubs, no clicks Abd:  soft, positive bowel sounds, no organomegally, no rebound, no guarding Ext:  2 plus pulses, no edema, no cyanosis, no clubbing Skin:  No rashes no nodules Neuro:  CN II through  XII intact, motor grossly intact  EKG - atypical atrial flutter.  DEVICE  Normal device function.  See PaceArt for details.    A/P  1. Sinus node dysfunction - as she has had more RVR, and has known sinus node dysfunction, I have recommended we proceed with PPM. She will need her port o cath removed before hand. We will do both at the same procedure. I will likely recommend AV node ablation at the time of her PPM, especially if we can get a nice left bundle lead placement.  2. PAF - she is now in atrial flutter with a RVR on low dose amiodarone. She has felt poorly. Her HR has been up.  3. LV dysfunction - her EF normalized at her last echo. She will continue  4. Obesity - she is encouraged to lose weight.    Felicia Overlie Bion Todorov,MD

## 2022-04-01 NOTE — Patient Instructions (Signed)
Medication Instructions:  Your physician recommends that you continue on your current medications as directed. Please refer to the Current Medication list given to you today.  *If you need a refill on your cardiac medications before your next appointment, please call your pharmacy*   Lab Work: NONE   If you have labs (blood work) drawn today and your tests are completely normal, you will receive your results only by: [place] (if you have MyChart) OR A paper copy in the mail If you have any lab test that is abnormal or we need to change your treatment, we will call you to review the results.   Testing/Procedures: Your physician has recommended that you have a pacemaker inserted. A pacemaker is a small device that is placed under the skin of your chest or abdomen to help control abnormal heart rhythms. This device uses electrical pulses to prompt the heart to beat at a normal rate. Pacemakers are used to treat heart rhythms that are too slow. Wire (leads) are attached to the pacemaker that goes into the chambers of you heart. This is done in the hospital and usually requires and overnight stay. Please see the instruction sheet given to you today for more information.    Follow-Up: At [facility], you and your health needs are our priority.  As part of our continuing mission to provide you with exceptional heart care, we have created designated Provider Care Teams.  These Care Teams include your primary Cardiologist (physician) and Advanced Practice Providers (APPs -  Physician Assistants and Nurse Practitioners) who all work together to provide you with the care you need, when you need it.  We recommend signing up for the patient portal called "MyChart".  Sign up information is provided on this After Visit Summary.  MyChart is used to connect with patients for Virtual Visits (Telemedicine).  Patients are able to view lab/test results, encounter notes, upcoming appointments, etc.   Non-urgent messages can be sent to your provider as well.   To learn more about what you can do with MyChart, go to [email or web address].    Your next appointment:   3 month(s)  The format for your next appointment:   In Person  Provider:   [name], MD    Other Instructions Thank you for choosing [place]!    Important Information About Sugar

## 2022-04-01 NOTE — Progress Notes (Signed)
HPI Felicia Ruiz returns today for followup. She is a pleasant 76 yo woman with a h/o tachy/brady syndrome. She was admitted to the hospital a couple of months ago with severe sinus node dysfunction. We held her many AV node blocking drugs and left her on low dose amiodarone. She has an indwelling port a cath which has been in placed for 14 years. I recommended removal but she declined. She has not had chest pain. She has class 3 dyspnea and renal insufficiency. She denies edema.  Since her last visit, she has felt poorly in the past few months. She does not have palpitations. More sob and swelling. She was seen in the [place] ED with atrial fib/flutter and volume overload and treated with IV diuretic therapy. Allergies  Allergen Reactions   Levofloxacin     UNK reaction   Statins     Don't want to take them d/t hearing bad things about statins     Current Outpatient Medications  Medication Sig Dispense Refill   allopurinol (ZYLOPRIM) 100 MG tablet Take 100 mg by mouth daily.     amiodarone (PACERONE) 200 MG tablet Take 0.5 tablets (100 mg total) by mouth daily. 30 tablet 3   furosemide (LASIX) 40 MG tablet Take 20 mg by mouth every other day.     isosorbide dinitrate (ISORDIL) 10 MG tablet Take 10 mg by mouth 2 (two) times daily.     levothyroxine (SYNTHROID) 112 MCG tablet Take by mouth.     MAGNESIUM OXIDE PO Take 250 mg by mouth in the morning, at noon, and at bedtime.     metolazone (ZAROXOLYN) 2.5 MG tablet Take 1 tablet (2.5 mg total) by mouth daily as needed (For weight gain of 3 pounds in a day or 5 pounds in a week.). 30 tablet 3   potassium chloride (MICRO-K) 10 MEQ CR capsule Take 1 capsule (10 mEq total) by mouth daily. Take an extra 20 mEq on days you take the metolazone/Zaroxolyn 40 capsule 3   No current facility-administered medications for this visit.     Past Medical History:  Diagnosis Date   Atrial fibrillation ([place]) 12/18/2020   Chronic kidney disease  (CKD) 12/18/2020   Cirrhosis of liver ([place]) 12/18/2020   Essential hypertension 12/18/2020   HFrEF (heart failure with reduced ejection fraction) ([place]) 12/18/2020   History of breast cancer 12/18/2020   Nonischemic cardiomyopathy ([place]) 12/18/2020   Pulmonary hypertension ([place]) 12/18/2020    ROS:   All systems reviewed and negative except as noted in the HPI.   Past Surgical History:  Procedure Laterality Date   BREAST LUMPECTOMY Right    PORTA CATH INSERTION Right 2008     Family History  Problem Relation Age of Onset   Hypertension Mother    Hypertension Father      Social History   Socioeconomic History   Marital status: Widowed    Spouse name: Not on file   Number of children: Not on file   Years of education: Not on file   Highest education level: Not on file  Occupational History   Not on file  Tobacco Use   Smoking status: Never   Smokeless tobacco: Never  Vaping Use   Vaping Use: Never used  Substance and Sexual Activity   Alcohol use: Never   Drug use: Never   Sexual activity: Not on file  Other Topics Concern   Not on file  Social History Narrative   Not on file  Social Determinants of Health   Financial Resource Strain: Not on file  Food Insecurity: Not on file  Transportation Needs: Not on file  Physical Activity: Not on file  Stress: Not on file  Social Connections: Not on file  Intimate Partner Violence: Not on file     BP 120/78   Pulse 95   Ht 5\' 3"  (1.6 m)   Wt 226 lb 4.8 oz (102.6 kg)   SpO2 96%   BMI 40.09 kg/m   Physical Exam:  Well appearing NAD HEENT: Unremarkable Neck:  No JVD, no thyromegally Lymphatics:  No adenopathy Back:  No CVA tenderness Lungs:  Clear with basilar rales. HEART:  Regular rate rhythm, no murmurs, no rubs, no clicks Abd:  soft, positive bowel sounds, no organomegally, no rebound, no guarding Ext:  2 plus pulses, no edema, no cyanosis, no clubbing Skin:  No rashes no nodules Neuro:  CN II through  XII intact, motor grossly intact  EKG - atypical atrial flutter.  DEVICE  Normal device function.  See PaceArt for details.    A/P  1. Sinus node dysfunction - as she has had more RVR, and has known sinus node dysfunction, I have recommended we proceed with PPM. She will need her port o cath removed before hand. We will do both at the same procedure. I will likely recommend AV node ablation at the time of her PPM, especially if we can get a nice left bundle lead placement.  2. PAF - she is now in atrial flutter with a RVR on low dose amiodarone. She has felt poorly. Her HR has been up.  3. LV dysfunction - her EF normalized at her last echo. She will continue  4. Obesity - she is encouraged to lose weight.    Felicia Overlie [name],MD

## 2022-04-10 ENCOUNTER — Encounter (HOSPITAL_COMMUNITY): Payer: Self-pay | Admitting: Internal Medicine

## 2022-04-10 NOTE — Progress Notes (Signed)
PCP - Dr Leone Haven Cardiologist - Dr [name]  Chest x-ray - 03/18/22 (2V) EKG - 04/01/22 Stress Test - 02/2018 ECHO - 12/18/20 Cardiac Cath - n/a  ICD Pacemaker - Will be getting a Medtronic PPM.  Rep Leanna Sato notified DOS, copied Lindsi, RN with information.  Tish in OR was notified via phone.    Sleep Study -  n/a CPAP - none  Anesthesia review: Yes  STOP now taking any Aspirin (unless otherwise instructed by your surgeon), Aleve, Naproxen, Ibuprofen, Motrin, Advil, Goody's, BC's, all herbal medications, fish oil, and all vitamins.   Coronavirus Screening Do you have any of the following symptoms:  Cough yes/no: No Fever (>100.68F)  yes/no: No Runny nose yes/no: No Sore throat yes/no: No Difficulty breathing/shortness of breath  yes/no: No  Have you traveled in the last 14 days and where? yes/no: No  Patient verbalized understanding of instructions that were given via phone.

## 2022-04-12 ENCOUNTER — Inpatient Hospital Stay (HOSPITAL_COMMUNITY): Payer: Medicare HMO | Admitting: Certified Registered Nurse Anesthetist

## 2022-04-13 ENCOUNTER — Encounter (HOSPITAL_COMMUNITY)
Admission: RE | Disposition: A | Discharge status: Home / Self Care | Payer: Self-pay | Source: Ambulatory Visit | Attending: Internal Medicine

## 2022-04-13 ENCOUNTER — Encounter (HOSPITAL_COMMUNITY): Payer: Self-pay | Admitting: Internal Medicine

## 2022-04-13 ENCOUNTER — Other Ambulatory Visit: Payer: Self-pay

## 2022-04-13 ENCOUNTER — Ambulatory Visit (HOSPITAL_COMMUNITY)
Admission: RE | Admit: 2022-04-13 | Discharge: 2022-04-14 | Disposition: A | Discharge status: Home / Self Care | Payer: Medicare HMO | Source: Ambulatory Visit | Attending: Internal Medicine | Admitting: Internal Medicine

## 2022-04-13 DIAGNOSIS — I4819 Other persistent atrial fibrillation: Secondary | ICD-10-CM | POA: Diagnosis not present

## 2022-04-13 DIAGNOSIS — E669 Obesity, unspecified: Secondary | ICD-10-CM | POA: Insufficient documentation

## 2022-04-13 DIAGNOSIS — I495 Sick sinus syndrome: Principal | ICD-10-CM | POA: Diagnosis present

## 2022-04-13 DIAGNOSIS — I4892 Unspecified atrial flutter: Secondary | ICD-10-CM | POA: Insufficient documentation

## 2022-04-13 DIAGNOSIS — I428 Other cardiomyopathies: Secondary | ICD-10-CM | POA: Diagnosis not present

## 2022-04-13 DIAGNOSIS — N189 Chronic kidney disease, unspecified: Secondary | ICD-10-CM | POA: Insufficient documentation

## 2022-04-13 DIAGNOSIS — Z6835 Body mass index (BMI) 35.0-35.9, adult: Secondary | ICD-10-CM | POA: Diagnosis not present

## 2022-04-13 DIAGNOSIS — K7460 Unspecified cirrhosis of liver: Secondary | ICD-10-CM | POA: Insufficient documentation

## 2022-04-13 DIAGNOSIS — Z79899 Other long term (current) drug therapy: Secondary | ICD-10-CM | POA: Insufficient documentation

## 2022-04-13 DIAGNOSIS — I5022 Chronic systolic (congestive) heart failure: Secondary | ICD-10-CM | POA: Diagnosis not present

## 2022-04-13 DIAGNOSIS — I272 Pulmonary hypertension, unspecified: Secondary | ICD-10-CM | POA: Diagnosis not present

## 2022-04-13 DIAGNOSIS — I130 Hypertensive heart and chronic kidney disease with heart failure and stage 1 through stage 4 chronic kidney disease, or unspecified chronic kidney disease: Secondary | ICD-10-CM | POA: Diagnosis not present

## 2022-04-13 HISTORY — PX: PACEMAKER IMPLANT: EP1218

## 2022-04-13 HISTORY — DX: Other psychoactive substance abuse, uncomplicated: F19.10

## 2022-04-13 LAB — SURGICAL PCR SCREEN
MRSA, PCR: NEGATIVE
Staphylococcus aureus: NEGATIVE

## 2022-04-13 LAB — ABO/RH: ABO/RH(D): A POS

## 2022-04-13 LAB — PREPARE RBC (CROSSMATCH)

## 2022-04-13 SURGERY — PACEMAKER IMPLANT
Anesthesia: General

## 2022-04-13 MED ORDER — SODIUM CHLORIDE 0.9 % IV SOLN
INTRAVENOUS | Status: AC
  Filled 2022-04-13: qty 2

## 2022-04-13 MED ORDER — LIDOCAINE HCL (PF) 1 % IJ SOLN
INTRAMUSCULAR | Status: AC
  Filled 2022-04-13: qty 60

## 2022-04-13 MED ORDER — SODIUM CHLORIDE 0.9% IV SOLUTION: Freq: Once | INTRAVENOUS | Status: AC

## 2022-04-13 MED ORDER — HEPARIN (PORCINE) IN NACL 1000-0.9 UT/500ML-% IV SOLN
INTRAVENOUS | Status: DC | PRN
  Administered 2022-04-13: 500 mL

## 2022-04-13 MED ORDER — SODIUM CHLORIDE 0.9 % IV SOLN
INTRAVENOUS | Status: DC | PRN
  Administered 2022-04-13: 80 mg

## 2022-04-13 MED ORDER — CHLORHEXIDINE GLUCONATE 4 % EX LIQD
4.0000 | Freq: Once | CUTANEOUS | Status: DC
  Filled 2022-04-13: qty 60

## 2022-04-13 MED ORDER — SODIUM CHLORIDE 0.9 % IV SOLN: INTRAVENOUS | Status: DC

## 2022-04-13 MED ORDER — LACTATED RINGERS IV SOLN: INTRAVENOUS | Status: DC

## 2022-04-13 MED ORDER — CEFAZOLIN SODIUM-DEXTROSE 1-4 GM/50ML-% IV SOLN
1.0000 g | Freq: Three times a day (TID) | INTRAVENOUS | Status: AC
  Administered 2022-04-13 – 2022-04-14 (×2): 1 g via INTRAVENOUS
  Filled 2022-04-13 (×2): qty 50

## 2022-04-13 MED ORDER — MIDAZOLAM HCL 5 MG/5ML IJ SOLN
INTRAMUSCULAR | Status: AC
  Filled 2022-04-13: qty 5

## 2022-04-13 MED ORDER — FENTANYL CITRATE (PF) 100 MCG/2ML IJ SOLN
INTRAMUSCULAR | Status: DC | PRN
  Administered 2022-04-13 (×3): 12.5 ug via INTRAVENOUS

## 2022-04-13 MED ORDER — MIDAZOLAM HCL 2 MG/2ML IJ SOLN
INTRAMUSCULAR | Status: DC | PRN
  Administered 2022-04-13 (×2): 1 mg via INTRAVENOUS

## 2022-04-13 MED ORDER — FENTANYL CITRATE (PF) 100 MCG/2ML IJ SOLN
INTRAMUSCULAR | Status: AC
  Filled 2022-04-13: qty 2

## 2022-04-13 MED ORDER — CEFAZOLIN SODIUM-DEXTROSE 2-4 GM/100ML-% IV SOLN
INTRAVENOUS | Status: AC
  Filled 2022-04-13: qty 100

## 2022-04-13 MED ORDER — MIDAZOLAM HCL 5 MG/5ML IJ SOLN
INTRAMUSCULAR | Status: DC | PRN
  Administered 2022-04-13: 1 mg via INTRAVENOUS

## 2022-04-13 MED ORDER — CEFAZOLIN SODIUM-DEXTROSE 2-4 GM/100ML-% IV SOLN
2.0000 g | INTRAVENOUS | Status: AC
  Administered 2022-04-13: 2 g via INTRAVENOUS
  Filled 2022-04-13: qty 100

## 2022-04-13 MED ORDER — ACETAMINOPHEN 325 MG PO TABS
325.0000 mg | ORAL_TABLET | ORAL | Status: DC | PRN
  Filled 2022-04-13: qty 2

## 2022-04-13 MED ORDER — POVIDONE-IODINE 10 % EX SWAB
2.0000 | Freq: Once | CUTANEOUS | Status: AC
  Administered 2022-04-13: 2 via TOPICAL

## 2022-04-13 MED ORDER — ONDANSETRON HCL 4 MG/2ML IJ SOLN: 4.0000 mg | Freq: Four times a day (QID) | INTRAMUSCULAR | Status: DC | PRN

## 2022-04-13 MED ORDER — LIDOCAINE HCL (PF) 1 % IJ SOLN
INTRAMUSCULAR | Status: DC | PRN
  Administered 2022-04-13: 60 mL

## 2022-04-13 MED ORDER — SODIUM CHLORIDE 0.9 % IV SOLN
80.0000 mg | INTRAVENOUS | Status: AC
  Administered 2022-04-13: 80 mg

## 2022-04-13 MED ORDER — CHLORHEXIDINE GLUCONATE 0.12 % MT SOLN
OROMUCOSAL | Status: AC
  Administered 2022-04-13: 15 mL
  Filled 2022-04-13: qty 15

## 2022-04-13 SURGICAL SUPPLY — 12 items
CABLE SURGICAL S-101-97-12 (CABLE) ×2 IMPLANT
CATH RIGHTSITE C315HIS02 (CATHETERS) ×1 IMPLANT
IPG PACE AZUR XT DR MRI W1DR01 (Pacemaker) IMPLANT
LEAD CAPSURE NOVUS 5076-52CM (Lead) ×1 IMPLANT
LEAD CAPSURE NOVUS 5076-58CM (Lead) ×1 IMPLANT
LEAD SELECT SECURE 3830 383069 (Lead) IMPLANT
PACE AZURE XT DR MRI W1DR01 (Pacemaker) ×2 IMPLANT
PAD DEFIB RADIO PHYSIO CONN (PAD) ×2 IMPLANT
SELECT SECURE 3830 383069 (Lead) ×2 IMPLANT
SHEATH 7FR PRELUDE SNAP 13 (SHEATH) ×3 IMPLANT
TRAY PACEMAKER INSERTION (PACKS) ×2 IMPLANT
WIRE HI TORQ VERSACORE-J 145CM (WIRE) ×1 IMPLANT

## 2022-04-13 NOTE — Interval H&P Note (Deleted)
History and Physical Interval Note:  04/13/2022 2:37 PM  Felicia Ruiz  has presented today for surgery, with the diagnosis of TACHY BRADY SYNDROME.  The various methods of treatment have been discussed with the patient and family. After consideration of risks, benefits and other options for treatment, the patient has consented to  Procedure(s): PACEMAKER IMPLANT (N/A) PORTA CATH REMOVAL (N/A) as a surgical intervention.  The patient's history has been reviewed, patient examined, no change in status, stable for surgery.  I have reviewed the patient's chart and labs.  Questions were answered to the patient's satisfaction.     [name]

## 2022-04-13 NOTE — Interval H&P Note (Signed)
History and Physical Interval Note:  04/13/2022 2:32 PM  Felicia Ruiz  has presented today for surgery, with the diagnosis of TACHY BRADY SYNDROME.  The various methods of treatment have been discussed with the patient and family. After consideration of risks, benefits and other options for treatment, the patient has consented to  Procedure(s): PACEMAKER IMPLANT (N/A) PORTA CATH REMOVAL (N/A) as a surgical intervention.  The patient's history has been reviewed, patient examined, no change in status, stable for surgery.  I have reviewed the patient's chart and labs.  Questions were answered to the patient's satisfaction.     Felicia Ruiz  EP Attending  Since her last clinic visit, I have reviewed the literature and discussed with other colleagues. I think that the risk/benefit favors leaving her Port in place as it is not being used and her overall fragile medical status makes port removal much higher risk. As it is not being used, and has been in place over 12 years, we will place her PPM for symptomatic tachy-brady and leave the port in place. As such we will not need the assistance of our anesthesia service.   [name] Kalan Rinn,MD

## 2022-04-13 NOTE — Plan of Care (Signed)
  Problem: Cardiac: Goal: Ability to achieve and maintain adequate cardiopulmonary perfusion will improve Outcome: Progressing   

## 2022-04-13 NOTE — Anesthesia Preprocedure Evaluation (Deleted)
Anesthesia Evaluation  Patient identified by MRN, date of birth, ID band Patient awake    Reviewed: Allergy & Precautions, NPO status , Patient's Chart, lab work & pertinent test results  History of Anesthesia Complications Negative for: history of anesthetic complications  Airway Mallampati: III  TM Distance: >3 FB Neck ROM: Full    Dental  (+) Edentulous Upper, Edentulous Lower, Dental Advisory Given   Pulmonary neg pulmonary ROS,    breath sounds clear to auscultation       Cardiovascular hypertension, (-) angina(-) Past MI + dysrhythmias Atrial Fibrillation  Rhythm:Irregular  1. Left ventricular ejection fraction, by estimation, is 50 to 55%. Left  ventricular ejection fraction by 3D volume is 53 %. The left ventricle has  low normal function. The left ventricle has no regional wall motion  abnormalities. Left ventricular  diastolic function could not be evaluated. There is the interventricular  septum is flattened in systole and diastole, consistent with right  ventricular pressure and volume overload.  2. Right ventricular systolic function is moderately reduced. The right  ventricular size is severely enlarged. There is moderately elevated  pulmonary artery systolic pressure. The estimated right ventricular  systolic pressure is 41.3 mmHg.  3. Right atrial size was moderately dilated.  4. The mitral valve is degenerative. Mild mitral valve regurgitation. No  evidence of mitral stenosis. Moderate to severe mitral annular  calcification.  5. The tricuspid valve is abnormal. Tricuspid valve regurgitation is  moderate.  6. The aortic valve is tricuspid. There is mild calcification of the  aortic valve. Aortic valve regurgitation is not visualized. Mild aortic  valve sclerosis is present, with no evidence of aortic valve stenosis.  7. Pulmonic valve regurgitation is moderate.  8. The inferior vena cava is normal in  size with <50% respiratory  variability, suggesting right atrial pressure of 8 mmHg.    Neuro/Psych negative neurological ROS  negative psych ROS   GI/Hepatic (+) Cirrhosis       , Lab Results      Component                Value               Date                      ALT                      44                  12/21/2020                AST                      46 (H)              12/21/2020                ALKPHOS                  101                 12/21/2020                BILITOT                  1.3 (H)             12/21/2020  Endo/Other  Hypothyroidism   Renal/GU CRFRenal diseaseLab Results      Component                Value               Date                      CREATININE               1.70 (H)            03/18/2022           Lab Results      Component                Value               Date                      K                        3.9                 03/18/2022                 Musculoskeletal   Abdominal   Peds  Hematology  (+) Blood dyscrasia, anemia , Lab Results      Component                Value               Date                      WBC                      6.3                 03/18/2022                HGB                      10.6 (L)            03/18/2022                HCT                      34.0 (L)            03/18/2022                MCV                      100.3 (H)           03/18/2022                PLT                      172                 03/18/2022           Lab Results      Component                Value               Date  INR                      1.1                 12/18/2020              Anesthesia Other Findings Tachycardia-bradycardia syndrome (HCC)  Reproductive/Obstetrics                            Anesthesia Physical Anesthesia Plan  ASA: 3  Anesthesia Plan: General   Post-op Pain Management: Minimal or no pain anticipated   Induction:  Intravenous  PONV Risk Score and Plan: 3 and Ondansetron and Dexamethasone  Airway Management Planned: Oral ETT  Additional Equipment: Arterial line  Intra-op Plan:   Post-operative Plan: Extubation in OR  Informed Consent: I have reviewed the patients History and Physical, chart, labs and discussed the procedure including the risks, benefits and alternatives for the proposed anesthesia with the patient or authorized representative who has indicated his/her understanding and acceptance.     Dental advisory given  Plan Discussed with: CRNA  Anesthesia Plan Comments:         Anesthesia Quick Evaluation

## 2022-04-13 NOTE — Progress Notes (Signed)
Per dr Ermalene Postin, okay with one IV in left arm. Patient states she may have had lymph nodes removed in right breast.

## 2022-04-14 ENCOUNTER — Encounter (HOSPITAL_COMMUNITY): Payer: Self-pay | Admitting: Internal Medicine

## 2022-04-14 ENCOUNTER — Ambulatory Visit (HOSPITAL_COMMUNITY): Payer: Medicare HMO

## 2022-04-14 DIAGNOSIS — I495 Sick sinus syndrome: Principal | ICD-10-CM

## 2022-04-14 DIAGNOSIS — Z79899 Other long term (current) drug therapy: Secondary | ICD-10-CM | POA: Diagnosis not present

## 2022-04-14 DIAGNOSIS — I4819 Other persistent atrial fibrillation: Secondary | ICD-10-CM | POA: Diagnosis not present

## 2022-04-14 DIAGNOSIS — I4892 Unspecified atrial flutter: Secondary | ICD-10-CM | POA: Diagnosis not present

## 2022-04-14 LAB — BPAM RBC
Blood Product Expiration Date: 202307262359
Blood Product Expiration Date: 202307262359
Blood Product Expiration Date: 202307262359
Blood Product Expiration Date: 202307262359
ISSUE DATE / TIME: 202307101438
ISSUE DATE / TIME: 202307101438
ISSUE DATE / TIME: 202307101438
ISSUE DATE / TIME: 202307101438
Unit Type and Rh: 6200
Unit Type and Rh: 6200
Unit Type and Rh: 6200
Unit Type and Rh: 6200

## 2022-04-14 LAB — TYPE AND SCREEN
ABO/RH(D): A POS
Antibody Screen: NEGATIVE
Unit division: 0
Unit division: 0
Unit division: 0
Unit division: 0

## 2022-04-14 MED ORDER — METOPROLOL SUCCINATE ER 25 MG PO TB24
25.0000 mg | ORAL_TABLET | Freq: Every day | ORAL | Status: DC
  Administered 2022-04-14: 25 mg via ORAL
  Filled 2022-04-14: qty 1

## 2022-04-14 MED ORDER — METOPROLOL SUCCINATE ER 25 MG PO TB24: 25.0000 mg | ORAL_TABLET | Freq: Every day | ORAL | 6 refills | Status: DC

## 2022-04-14 MED ORDER — ACETAMINOPHEN 325 MG PO TABS: 325.0000 mg | ORAL_TABLET | ORAL | Status: AC | PRN

## 2022-04-14 NOTE — Discharge Summary (Addendum)
ELECTROPHYSIOLOGY PROCEDURE DISCHARGE SUMMARY    Patient ID: Felicia Ruiz,  MRN: [id number], DOB/AGE: [id number] 77 y.o.  Admit date: 04/13/2022 Discharge date: 04/14/2022  Primary Care Physician: Leone Haven, MD  Primary Cardiologist: Ena Dawley, MD  Electrophysiologist: [name], MD   Primary Discharge Diagnosis:  Sinus node dysfunction status post pacemaker implantation this admission  Secondary Discharge Diagnosis:  Persistent atrial fibrillation LV dysfunction Obesity  Allergies  Allergen Reactions   Levofloxacin     UNK reaction - Pt unsure of allergy   Statins     Don't want to take them d/t hearing bad things about statins     Procedures This Admission:  1.  Implantation of a Medtronic Dual Chamber PPM on 04/13/2022 by Dr. Lovena Le. The patient received a Medtronic Azure XT DR [id number]  with a Medtronic CapSureFix Novus 5076 right atrial lead and a Medtronic CapSureFix Novus 5076 right ventricular lead. There were no immediate post procedure complications.   2.  CXR on 04/14/22 demonstrated no pneumothorax status post device implantation.    Brief HPI: Felicia Ruiz is a 76 y.o. female was followed by electrophysiology in the outpatient setting for  consideration of PPM implantation.  Past medical history includes above.  The patient has had sinus node dysfunction without reversible causes identified.  Risks, benefits, and alternatives to PPM implantation were reviewed with the patient who wished to proceed.   Hospital Course:  The patient was admitted and underwent implantation of a Medtronic dual chamber PPM with details as outlined above. She was monitored on telemetry overnight which demonstrated atrial fibrillation.  Left chest was without hematoma or ecchymosis.  The device was interrogated and found to be functioning normally.  CXR was obtained and demonstrated no pneumothorax status post device implantation.  Wound care, arm mobility,  and restrictions were reviewed with the patient.  The patient was examined and considered stable for discharge to home.    Anticoagulation resumption This patient is not on anticoagulation     Physical Exam: Vitals:   04/13/22 1927 04/14/22 0027 04/14/22 0429 04/14/22 0731  BP: (!) 104/57 103/71 108/74 114/64  Pulse: 87 92 90 91  Resp: 18 17 20 20   Temp: 97.8 F (36.6 C) 97.7 F (36.5 C) (!) 97.4 F (36.3 C)   TempSrc: Oral Oral Oral Oral  SpO2: 97% 97% 96% 96%  Weight:  89.9 kg    Height:        GEN- The patient is well appearing, alert and oriented x 3 today.   HEENT: normocephalic, atraumatic; sclera clear, conjunctiva pink; hearing intact; oropharynx clear; neck supple, no JVP Lymph- no cervical lymphadenopathy Lungs- Clear to ausculation bilaterally, normal work of breathing.  No wheezes, rales, rhonchi Heart- Regular rate and rhythm, no murmurs, rubs or gallops, PMI not laterally displaced GI- soft, non-tender, non-distended, bowel sounds present, no hepatosplenomegaly Extremities- no clubbing, cyanosis, or edema; DP/PT/radial pulses 2+ bilaterally MS- no significant deformity or atrophy Skin- warm and dry, no rash or lesion, left chest without hematoma/ecchymosis Psych- euthymic mood, full affect Neuro- strength and sensation are intact   Labs:   Lab Results  Component Value Date   WBC 6.3 03/18/2022   HGB 10.6 (L) 03/18/2022   HCT 34.0 (L) 03/18/2022   MCV 100.3 (H) 03/18/2022   PLT 172 03/18/2022   No results for input(s): "NA", "K", "CL", "CO2", "BUN", "CREATININE", "CALCIUM", "PROT", "BILITOT", "ALKPHOS", "ALT", "AST", "GLUCOSE" in the last 168 hours.  Invalid input(s): "LABALBU"  Discharge Medications:  Allergies as of 04/14/2022       Reactions   Levofloxacin    UNK reaction - Pt unsure of allergy   Statins    Don't want to take them d/t hearing bad things about statins        Medication List     TAKE these medications    acetaminophen 325  MG tablet Commonly known as: TYLENOL Take 1-2 tablets (325-650 mg total) by mouth every 4 (four) hours as needed for mild pain.   allopurinol 100 MG tablet Commonly known as: ZYLOPRIM Take 100 mg by mouth daily.   amiodarone 200 MG tablet Commonly known as: PACERONE Take 0.5 tablets (100 mg total) by mouth daily.   furosemide 40 MG tablet Commonly known as: LASIX Take 40 mg by mouth daily.   isosorbide dinitrate 10 MG tablet Commonly known as: ISORDIL Take 10 mg by mouth 2 (two) times daily.   levothyroxine 112 MCG tablet Commonly known as: SYNTHROID Take 112 mcg by mouth daily before breakfast.   MAGNESIUM OXIDE PO Take 500 mg by mouth in the morning and at bedtime.   metolazone 2.5 MG tablet Commonly known as: ZAROXOLYN Take 1 tablet (2.5 mg total) by mouth daily as needed (For weight gain of 3 pounds in a day or 5 pounds in a week.). What changed: when to take this   metoprolol succinate 25 MG 24 hr tablet Commonly known as: TOPROL-XL Take 1 tablet (25 mg total) by mouth daily.   potassium chloride 10 MEQ CR capsule Commonly known as: MICRO-K Take 1 capsule (10 mEq total) by mouth daily. Take an extra 20 mEq on days you take the metolazone/Zaroxolyn   vitamin B-12 1000 MCG tablet Commonly known as: CYANOCOBALAMIN Take 1,000 mcg by mouth daily.        Disposition:    Follow-up Information     Leone Haven, MD. Call.   Specialty: Family Medicine Why: Please follow up in a week. Contact information: [place] [address] [place] [facility] Follow up.   Why: on 7/26 at 2 pm for post pacemaker wound check Contact information: [place] [address] [phone]                Duration of Discharge Encounter: Greater than 30 minutes including physician time.  Jacalyn Lefevre, PA-C  04/14/2022 8:56  AM  EP Attending  Patient seen and examined. She is stable after PPM insertion for symptomatic tachy-brady syndrome. She is in atrial fib with a RVR. She is stable for DC with the addition of toprol which we will plan to uptitrate as an outpatient. Her PPM interrogation under my direction demonstrates normal DDD PM function. CXR looks good with stable lead position and no PTx.   [name] Deona Novitski,MD

## 2022-04-14 NOTE — TOC Progression Note (Signed)
Transition of Care [facility]) - Progression Note    Patient Details  Name: Felicia Ruiz MRN: [id number] Date of Birth: 06/22/1946  [facility] CM/SW Contact  Zenon Mayo, RN Phone Number: 04/14/2022, 9:03 AM  Clinical Narrative:    Patient is for dc today, her daughter will transport her home. She has no needs.        Expected Discharge Plan and Services           Expected Discharge Date: 04/14/22                                     Social Determinants of Health (SDOH) Interventions    Readmission Risk Interventions     No data to display

## 2022-04-14 NOTE — Discharge Instructions (Addendum)
After Your Pacemaker   You have a Medtronic Pacemaker  ACTIVITY Do not lift your arm above shoulder height for 1 week after your procedure. After 7 days, you may progress as below.  You should remove your sling 24 hours after your procedure, unless otherwise instructed by your provider.     Tuesday April 21, 2022  Wednesday April 22, 2022 Thursday April 23, 2022 Friday April 24, 2022   Do not lift, push, pull, or carry anything over 10 pounds with the affected arm until 6 weeks (Tuesday May 26, 2022 ) after your procedure.   You may drive AFTER your wound check, unless you have been told otherwise by your provider.   Ask your healthcare provider when you can go back to work   INCISION/Dressing If you are on a blood thinner such as Coumadin, Xarelto, Eliquis, Plavix, or Pradaxa please confirm with your provider when this should be resumed.   If large square, outer bandage is left in place, this can be removed after 24 hours from your procedure. Do not remove steri-strips or glue as below.   Monitor your Pacemaker site for redness, swelling, and drainage. Call the device clinic at [phone] if you experience these symptoms or fever/chills.  If your incision is sealed with Steri-strips or staples, you may shower 7 days after your procedure or when told by your provider. Do not remove the steri-strips or let the shower hit directly on your site. You may wash around your site with soap and water.    If you were discharged in a sling, please do not wear this during the day more than 48 hours after your surgery unless otherwise instructed. This may increase the risk of stiffness and soreness in your shoulder.   Avoid lotions, ointments, or perfumes over your incision until it is well-healed.  You may use a hot tub or a pool AFTER your wound check appointment if the incision is completely closed.  Pacemaker Alerts:  Some alerts are vibratory and others beep. These are NOT emergencies.  Please call our office to let us know. If this occurs at night or on weekends, it can wait until the next business day. Send a remote transmission.  If your device is capable of reading fluid status (for heart failure), you will be offered monthly monitoring to review this with you.   DEVICE MANAGEMENT Remote monitoring is used to monitor your pacemaker from home. This monitoring is scheduled every 91 days by our office. It allows [place] to keep an eye on the functioning of your device to ensure it is working properly. You will routinely see your Electrophysiologist annually (more often if necessary).   You should receive your ID card for your new device in 4-8 weeks. Keep this card with you at all times once received. Consider wearing a medical alert bracelet or necklace.  Your Pacemaker may be MRI compatible. This will be discussed at your next office visit/wound check.  You should avoid contact with strong electric or magnetic fields.   Do not use amateur (ham) radio equipment or electric (arc) welding torches. MP3 player headphones with magnets should not be used. Some devices are safe to use if held at least 12 inches (30 cm) from your Pacemaker. These include power tools, lawn mowers, and speakers. If you are unsure if something is safe to use, ask your health care provider.  When using your cell phone, hold it to the ear that is on the opposite side from the  Pacemaker. Do not leave your cell phone in a pocket over the Pacemaker.  You may safely use electric blankets, heating pads, computers, and microwave ovens.  Call the office right away if: You have chest pain. You feel more short of breath than you have felt before. You feel more light-headed than you have felt before. Your incision starts to open up.  This information is not intended to replace advice given to you by your health care provider. Make sure you discuss any questions you have with your health care provider.

## 2022-04-29 ENCOUNTER — Ambulatory Visit: Payer: Medicare HMO

## 2022-07-07 ENCOUNTER — Ambulatory Visit: Payer: Medicare HMO | Admitting: Internal Medicine

## 2022-07-14 ENCOUNTER — Ambulatory Visit (INDEPENDENT_AMBULATORY_CARE_PROVIDER_SITE_OTHER): Payer: Medicare HMO

## 2022-07-14 DIAGNOSIS — I480 Paroxysmal atrial fibrillation: Secondary | ICD-10-CM | POA: Diagnosis not present

## 2022-07-17 LAB — CUP PACEART REMOTE DEVICE CHECK
Battery Remaining Longevity: 174 mo
Battery Voltage: 3.2 V
Brady Statistic AP VP Percent: 0.16 %
Brady Statistic AP VS Percent: 74.07 %
Brady Statistic AS VP Percent: 0.03 %
Brady Statistic AS VS Percent: 25.82 %
Brady Statistic RA Percent Paced: 34.44 %
Brady Statistic RV Percent Paced: 3.4 %
Date Time Interrogation Session: 20231013010937
Implantable Lead Implant Date: 20230710
Implantable Lead Implant Date: 20230710
Implantable Lead Location: 753859
Implantable Lead Location: 753860
Implantable Lead Model: 5076
Implantable Lead Model: 5076
Implantable Pulse Generator Implant Date: 20230710
Lead Channel Impedance Value: 342 Ohm
Lead Channel Impedance Value: 418 Ohm
Lead Channel Impedance Value: 608 Ohm
Lead Channel Impedance Value: 760 Ohm
Lead Channel Pacing Threshold Amplitude: 0.625 V
Lead Channel Pacing Threshold Amplitude: 0.875 V
Lead Channel Pacing Threshold Pulse Width: 0.4 ms
Lead Channel Pacing Threshold Pulse Width: 0.4 ms
Lead Channel Sensing Intrinsic Amplitude: 15.375 mV
Lead Channel Sensing Intrinsic Amplitude: 15.375 mV
Lead Channel Sensing Intrinsic Amplitude: 3 mV
Lead Channel Sensing Intrinsic Amplitude: 3 mV
Lead Channel Setting Pacing Amplitude: 1.5 V
Lead Channel Setting Pacing Amplitude: 2 V
Lead Channel Setting Pacing Pulse Width: 0.4 ms
Lead Channel Setting Sensing Sensitivity: 1.2 mV

## 2022-07-28 NOTE — Progress Notes (Signed)
Remote pacemaker transmission.   

## 2022-08-06 ENCOUNTER — Ambulatory Visit: Payer: Medicare HMO | Admitting: Internal Medicine

## 2022-08-06 ENCOUNTER — Encounter: Payer: Self-pay | Admitting: Internal Medicine

## 2022-08-06 ENCOUNTER — Ambulatory Visit: Payer: Medicare HMO | Attending: Internal Medicine | Admitting: Internal Medicine

## 2022-08-06 VITALS — BP 120/62 | HR 60 | Ht 63.0 in | Wt 195.0 lb

## 2022-08-06 DIAGNOSIS — I495 Sick sinus syndrome: Secondary | ICD-10-CM | POA: Diagnosis not present

## 2022-08-06 DIAGNOSIS — I480 Paroxysmal atrial fibrillation: Secondary | ICD-10-CM

## 2022-08-06 NOTE — Patient Instructions (Signed)
Medication Instructions:  Your physician recommends that you continue on your current medications as directed. Please refer to the Current Medication list given to you today.  *If you need a refill on your cardiac medications before your next appointment, please call your pharmacy*   Lab Work: NONE   If you have labs (blood work) drawn today and your tests are completely normal, you will receive your results only by: MyChart Message (if you have MyChart) OR A paper copy in the mail If you have any lab test that is abnormal or we need to change your treatment, we will call you to review the results.   Testing/Procedures: NONE    Follow-Up: At [facility], you and your health needs are our priority.  As part of our continuing mission to provide you with exceptional heart care, we have created designated Provider Care Teams.  These Care Teams include your primary Cardiologist (physician) and Advanced Practice Providers (APPs -  Physician Assistants and Nurse Practitioners) who all work together to provide you with the care you need, when you need it.  We recommend signing up for the patient portal called "MyChart".  Sign up information is provided on this After Visit Summary.  MyChart is used to connect with patients for Virtual Visits (Telemedicine).  Patients are able to view lab/test results, encounter notes, upcoming appointments, etc.  Non-urgent messages can be sent to your provider as well.   To learn more about what you can do with MyChart, go to [email or web address].    Your next appointment:   1 year(s)  The format for your next appointment:   In Person  Provider:   Gregg Taylor, MD    Other Instructions Thank you for choosing [facility]!    Important Information About Sugar       

## 2022-08-06 NOTE — Progress Notes (Signed)
HPI Ms. Goettl returns today for followup. She is a pleasant 76 yo woman with a h/o tachy/brady syndrome. She was admitted to the hospital a couple of months ago with severe sinus node dysfunction. We held her many AV node blocking drugs and left her on low dose amiodarone. She has an indwelling port a cath which has been in placed for 15 years. I recommended removal but she declined. We reconsidered but ultimately decide to leave in place as it was not being used and was thought to be difficult to remove.She has not had chest pain. She has class 3 dyspnea and renal insufficiency. She denies edema.  Since her  PM insertion she has reverted back to NSR and her activity has improved. No syncope.  Allergies  Allergen Reactions   Levofloxacin     UNK reaction - Pt unsure of allergy   Statins     Don't want to take them d/t hearing bad things about statins     Current Outpatient Medications  Medication Sig Dispense Refill   acetaminophen (TYLENOL) 325 MG tablet Take 1-2 tablets (325-650 mg total) by mouth every 4 (four) hours as needed for mild pain.     allopurinol (ZYLOPRIM) 100 MG tablet Take 100 mg by mouth daily.     furosemide (LASIX) 40 MG tablet Take 40 mg by mouth daily.     isosorbide dinitrate (ISORDIL) 10 MG tablet Take 10 mg by mouth 2 (two) times daily.     levothyroxine (SYNTHROID) 112 MCG tablet Take 112 mcg by mouth daily before breakfast.     MAGNESIUM OXIDE PO Take 500 mg by mouth in the morning and at bedtime.     metolazone (ZAROXOLYN) 2.5 MG tablet Take 1 tablet (2.5 mg total) by mouth daily as needed (For weight gain of 3 pounds in a day or 5 pounds in a week.). (Patient taking differently: Take 2.5 mg by mouth daily as needed.) 30 tablet 3   metoprolol succinate (TOPROL-XL) 25 MG 24 hr tablet Take 12.5 mg by mouth daily.     potassium chloride (MICRO-K) 10 MEQ CR capsule Take 1 capsule (10 mEq total) by mouth daily. Take an extra 20 mEq on days you take the  metolazone/Zaroxolyn 40 capsule 3   vitamin B-12 (CYANOCOBALAMIN) 1000 MCG tablet Take 1,000 mcg by mouth daily.     amiodarone (PACERONE) 200 MG tablet Take 0.5 tablets (100 mg total) by mouth daily. (Patient not taking: Reported on 08/06/2022) 30 tablet 3   No current facility-administered medications for this visit.     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   Past Surgical History:  Procedure Laterality Date   BREAST LUMPECTOMY Right    PACEMAKER IMPLANT N/A 04/13/2022   Procedure: PACEMAKER IMPLANT;  Surgeon: Evans Lance, MD;  Location: [place];  Service: Cardiovascular;  Laterality: N/A;   PORTA CATH INSERTION Right 2008     Family History  Problem Relation Age of Onset   Hypertension Mother  Hypertension Father      Social History   Socioeconomic History   Marital status: Widowed    Spouse name: Not on file   Number of children: Not on file   Years of education: Not on file   Highest education level: Not on file  Occupational History   Not on file  Tobacco Use   Smoking status: Never   Smokeless tobacco: Never  Vaping Use   Vaping Use: Never used  Substance and Sexual Activity   Alcohol use: Never   Drug use: Never   Sexual activity: Not Currently    Birth control/protection: Post-menopausal  Other Topics Concern   Not on file  Social History Narrative   Not on file   Social Determinants of Health   Financial Resource Strain: Not on file  Food Insecurity: Not on file   Transportation Needs: Not on file  Physical Activity: Not on file  Stress: Not on file  Social Connections: Not on file  Intimate Partner Violence: Not on file     BP 120/62   Pulse 60   Ht 5\' 3"  (1.6 m)   Wt 195 lb (88.5 kg)   SpO2 100%   BMI 34.54 kg/m   Physical Exam:  Chronically ill appearing NAD HEENT: Unremarkable Neck:  No JVD, no thyromegally Lymphatics:  No adenopathy Back:  No CVA tenderness Lungs:  Clear with no wheezes HEART:  Regular rate rhythm, no murmurs, no rubs, no clicks Abd:  soft, positive bowel sounds, no organomegally, no rebound, no guarding Ext:  2 plus pulses, no edema, no cyanosis, no clubbing Skin:  No rashes no nodules Neuro:  CN II through XII intact, motor grossly intact  DEVICE  Normal device function.  See PaceArt for details.   Assess/Plan:  1. Sinus node dysfunction - she is doing well s/p PPM insertion. She is asymptomatic.  2. PAF - she has returned to NSR 3. LV dysfunction - her EF normalized at her last echo. She will continue  4. Obesity - she is encouraged to lose weight.    [name] Donata Reddick,MD

## 2022-10-13 ENCOUNTER — Ambulatory Visit (INDEPENDENT_AMBULATORY_CARE_PROVIDER_SITE_OTHER): Payer: Medicare HMO

## 2022-10-13 DIAGNOSIS — I495 Sick sinus syndrome: Secondary | ICD-10-CM | POA: Diagnosis not present

## 2022-10-14 LAB — CUP PACEART REMOTE DEVICE CHECK
Battery Remaining Longevity: 169 mo
Battery Voltage: 3.18 V
Brady Statistic AP VP Percent: 0.05 %
Brady Statistic AP VS Percent: 59.61 %
Brady Statistic AS VP Percent: 0.04 %
Brady Statistic AS VS Percent: 40.31 %
Brady Statistic RA Percent Paced: 58.47 %
Brady Statistic RV Percent Paced: 0.08 %
Date Time Interrogation Session: 20240110112234
Implantable Lead Connection Status: 753985
Implantable Lead Connection Status: 753985
Implantable Lead Implant Date: 20230710
Implantable Lead Implant Date: 20230710
Implantable Lead Location: 753859
Implantable Lead Location: 753860
Implantable Lead Model: 5076
Implantable Lead Model: 5076
Implantable Pulse Generator Implant Date: 20230710
Lead Channel Impedance Value: 380 Ohm
Lead Channel Impedance Value: 475 Ohm
Lead Channel Impedance Value: 551 Ohm
Lead Channel Impedance Value: 684 Ohm
Lead Channel Pacing Threshold Amplitude: 0.5 V
Lead Channel Pacing Threshold Amplitude: 0.75 V
Lead Channel Pacing Threshold Pulse Width: 0.4 ms
Lead Channel Pacing Threshold Pulse Width: 0.4 ms
Lead Channel Sensing Intrinsic Amplitude: 14 mV
Lead Channel Sensing Intrinsic Amplitude: 14 mV
Lead Channel Sensing Intrinsic Amplitude: 2.875 mV
Lead Channel Sensing Intrinsic Amplitude: 2.875 mV
Lead Channel Setting Pacing Amplitude: 1.5 V
Lead Channel Setting Pacing Amplitude: 2 V
Lead Channel Setting Pacing Pulse Width: 0.4 ms
Lead Channel Setting Sensing Sensitivity: 1.2 mV
Zone Setting Status: 755011

## 2022-11-06 NOTE — Progress Notes (Signed)
Remote pacemaker transmission.   

## 2022-11-15 ENCOUNTER — Other Ambulatory Visit: Payer: Self-pay | Admitting: Student

## 2023-01-12 ENCOUNTER — Ambulatory Visit (INDEPENDENT_AMBULATORY_CARE_PROVIDER_SITE_OTHER): Payer: Medicare HMO

## 2023-01-12 DIAGNOSIS — I495 Sick sinus syndrome: Secondary | ICD-10-CM

## 2023-01-12 LAB — CUP PACEART REMOTE DEVICE CHECK
Battery Remaining Longevity: 165 mo
Battery Voltage: 3.15 V
Brady Statistic AP VP Percent: 0.03 %
Brady Statistic AP VS Percent: 58.23 %
Brady Statistic AS VP Percent: 0.03 %
Brady Statistic AS VS Percent: 41.71 %
Brady Statistic RA Percent Paced: 57.14 %
Brady Statistic RV Percent Paced: 0.06 %
Date Time Interrogation Session: 20240409103224
Implantable Lead Connection Status: 753985
Implantable Lead Connection Status: 753985
Implantable Lead Implant Date: 20230710
Implantable Lead Implant Date: 20230710
Implantable Lead Location: 753859
Implantable Lead Location: 753860
Implantable Lead Model: 5076
Implantable Lead Model: 5076
Implantable Pulse Generator Implant Date: 20230710
Lead Channel Impedance Value: 361 Ohm
Lead Channel Impedance Value: 437 Ohm
Lead Channel Impedance Value: 513 Ohm
Lead Channel Impedance Value: 703 Ohm
Lead Channel Pacing Threshold Amplitude: 0.5 V
Lead Channel Pacing Threshold Amplitude: 0.625 V
Lead Channel Pacing Threshold Pulse Width: 0.4 ms
Lead Channel Pacing Threshold Pulse Width: 0.4 ms
Lead Channel Sensing Intrinsic Amplitude: 13.375 mV
Lead Channel Sensing Intrinsic Amplitude: 13.375 mV
Lead Channel Sensing Intrinsic Amplitude: 2.75 mV
Lead Channel Sensing Intrinsic Amplitude: 2.75 mV
Lead Channel Setting Pacing Amplitude: 1.5 V
Lead Channel Setting Pacing Amplitude: 2 V
Lead Channel Setting Pacing Pulse Width: 0.4 ms
Lead Channel Setting Sensing Sensitivity: 1.2 mV
Zone Setting Status: 755011

## 2023-02-19 NOTE — Progress Notes (Signed)
Remote pacemaker transmission.   

## 2023-04-13 ENCOUNTER — Ambulatory Visit (INDEPENDENT_AMBULATORY_CARE_PROVIDER_SITE_OTHER): Payer: Medicare HMO

## 2023-04-13 DIAGNOSIS — I495 Sick sinus syndrome: Secondary | ICD-10-CM

## 2023-04-14 LAB — CUP PACEART REMOTE DEVICE CHECK
Battery Remaining Longevity: 163 mo
Battery Voltage: 3.11 V
Brady Statistic AP VP Percent: 0.02 %
Brady Statistic AP VS Percent: 36.82 %
Brady Statistic AS VP Percent: 0.04 %
Brady Statistic AS VS Percent: 63.13 %
Brady Statistic RA Percent Paced: 35.89 %
Brady Statistic RV Percent Paced: 0.05 %
Date Time Interrogation Session: 20240709034507
Implantable Lead Connection Status: 753985
Implantable Lead Connection Status: 753985
Implantable Lead Implant Date: 20230710
Implantable Lead Implant Date: 20230710
Implantable Lead Location: 753859
Implantable Lead Location: 753860
Implantable Lead Model: 5076
Implantable Lead Model: 5076
Implantable Pulse Generator Implant Date: 20230710
Lead Channel Impedance Value: 361 Ohm
Lead Channel Impedance Value: 475 Ohm
Lead Channel Impedance Value: 570 Ohm
Lead Channel Impedance Value: 760 Ohm
Lead Channel Pacing Threshold Amplitude: 0.625 V
Lead Channel Pacing Threshold Amplitude: 0.75 V
Lead Channel Pacing Threshold Pulse Width: 0.4 ms
Lead Channel Pacing Threshold Pulse Width: 0.4 ms
Lead Channel Sensing Intrinsic Amplitude: 13.375 mV
Lead Channel Sensing Intrinsic Amplitude: 13.375 mV
Lead Channel Sensing Intrinsic Amplitude: 2.5 mV
Lead Channel Sensing Intrinsic Amplitude: 2.5 mV
Lead Channel Setting Pacing Amplitude: 1.5 V
Lead Channel Setting Pacing Amplitude: 2 V
Lead Channel Setting Pacing Pulse Width: 0.4 ms
Lead Channel Setting Sensing Sensitivity: 1.2 mV
Zone Setting Status: 755011

## 2023-05-06 NOTE — Progress Notes (Signed)
Remote pacemaker transmission.   

## 2023-07-13 ENCOUNTER — Ambulatory Visit: Payer: Medicare HMO

## 2023-07-13 DIAGNOSIS — I495 Sick sinus syndrome: Secondary | ICD-10-CM | POA: Diagnosis not present

## 2023-07-13 LAB — CUP PACEART REMOTE DEVICE CHECK
Battery Remaining Longevity: 162 mo
Battery Voltage: 3.08 V
Brady Statistic AP VP Percent: 0.03 %
Brady Statistic AP VS Percent: 28.92 %
Brady Statistic AS VP Percent: 0.04 %
Brady Statistic AS VS Percent: 71.02 %
Brady Statistic RA Percent Paced: 28.77 %
Brady Statistic RV Percent Paced: 0.07 %
Date Time Interrogation Session: 20241008002115
Implantable Lead Connection Status: 753985
Implantable Lead Connection Status: 753985
Implantable Lead Implant Date: 20230710
Implantable Lead Implant Date: 20230710
Implantable Lead Location: 753859
Implantable Lead Location: 753860
Implantable Lead Model: 5076
Implantable Lead Model: 5076
Implantable Pulse Generator Implant Date: 20230710
Lead Channel Impedance Value: 361 Ohm
Lead Channel Impedance Value: 513 Ohm
Lead Channel Impedance Value: 589 Ohm
Lead Channel Impedance Value: 798 Ohm
Lead Channel Pacing Threshold Amplitude: 0.5 V
Lead Channel Pacing Threshold Amplitude: 0.75 V
Lead Channel Pacing Threshold Pulse Width: 0.4 ms
Lead Channel Pacing Threshold Pulse Width: 0.4 ms
Lead Channel Sensing Intrinsic Amplitude: 15 mV
Lead Channel Sensing Intrinsic Amplitude: 15 mV
Lead Channel Sensing Intrinsic Amplitude: 2.5 mV
Lead Channel Sensing Intrinsic Amplitude: 2.5 mV
Lead Channel Setting Pacing Amplitude: 1.5 V
Lead Channel Setting Pacing Amplitude: 2 V
Lead Channel Setting Pacing Pulse Width: 0.4 ms
Lead Channel Setting Sensing Sensitivity: 1.2 mV
Zone Setting Status: 755011

## 2023-07-29 NOTE — Progress Notes (Signed)
Remote pacemaker transmission.   

## 2023-08-06 ENCOUNTER — Encounter: Payer: Medicare HMO | Admitting: Internal Medicine

## 2023-10-12 ENCOUNTER — Ambulatory Visit (INDEPENDENT_AMBULATORY_CARE_PROVIDER_SITE_OTHER): Payer: Medicare HMO

## 2023-10-12 DIAGNOSIS — I495 Sick sinus syndrome: Secondary | ICD-10-CM | POA: Diagnosis not present

## 2023-10-13 LAB — CUP PACEART REMOTE DEVICE CHECK
Battery Remaining Longevity: 160 mo
Battery Voltage: 3.06 V
Brady Statistic AP VP Percent: 0.02 %
Brady Statistic AP VS Percent: 0 %
Brady Statistic AS VP Percent: 0.37 %
Brady Statistic AS VS Percent: 99.34 %
Brady Statistic RA Percent Paced: 0.97 %
Brady Statistic RV Percent Paced: 8.38 %
Date Time Interrogation Session: 20250106221722
Implantable Lead Connection Status: 753985
Implantable Lead Connection Status: 753985
Implantable Lead Implant Date: 20230710
Implantable Lead Implant Date: 20230710
Implantable Lead Location: 753859
Implantable Lead Location: 753860
Implantable Lead Model: 5076
Implantable Lead Model: 5076
Implantable Pulse Generator Implant Date: 20230710
Lead Channel Impedance Value: 380 Ohm
Lead Channel Impedance Value: 551 Ohm
Lead Channel Impedance Value: 665 Ohm
Lead Channel Impedance Value: 893 Ohm
Lead Channel Pacing Threshold Amplitude: 0.625 V
Lead Channel Pacing Threshold Amplitude: 0.75 V
Lead Channel Pacing Threshold Pulse Width: 0.4 ms
Lead Channel Pacing Threshold Pulse Width: 0.4 ms
Lead Channel Sensing Intrinsic Amplitude: 2.25 mV
Lead Channel Sensing Intrinsic Amplitude: 2.25 mV
Lead Channel Sensing Intrinsic Amplitude: 21.125 mV
Lead Channel Sensing Intrinsic Amplitude: 21.125 mV
Lead Channel Setting Pacing Amplitude: 1.5 V
Lead Channel Setting Pacing Amplitude: 2 V
Lead Channel Setting Pacing Pulse Width: 0.4 ms
Lead Channel Setting Sensing Sensitivity: 1.2 mV
Zone Setting Status: 755011

## 2023-11-03 ENCOUNTER — Encounter: Payer: Self-pay | Admitting: Internal Medicine

## 2023-11-03 ENCOUNTER — Ambulatory Visit: Payer: Medicare HMO | Attending: Internal Medicine | Admitting: Internal Medicine

## 2023-11-03 VITALS — BP 120/74 | HR 78 | Ht 64.0 in | Wt 199.6 lb

## 2023-11-03 DIAGNOSIS — I480 Paroxysmal atrial fibrillation: Secondary | ICD-10-CM

## 2023-11-03 LAB — CUP PACEART INCLINIC DEVICE CHECK
Date Time Interrogation Session: 20250129201244
Implantable Lead Connection Status: 753985
Implantable Lead Connection Status: 753985
Implantable Lead Implant Date: 20230710
Implantable Lead Implant Date: 20230710
Implantable Lead Location: 753859
Implantable Lead Location: 753860
Implantable Lead Model: 5076
Implantable Lead Model: 5076
Implantable Pulse Generator Implant Date: 20230710

## 2023-11-03 MED ORDER — AMIODARONE HCL 200 MG PO TABS: ORAL_TABLET | ORAL | 3 refills | Status: AC

## 2023-11-03 NOTE — Patient Instructions (Signed)
Medication Instructions:  Your physician has recommended you make the following change in your medication:   Take Amiodarone 200 mg Daily and None on Sunday   *If you need a refill on your cardiac medications before your next appointment, please call your pharmacy*   Lab Work: NONE   If you have labs (blood work) drawn today and your tests are completely normal, you will receive your results only by: MyChart Message (if you have MyChart) OR A paper copy in the mail If you have any lab test that is abnormal or we need to change your treatment, we will call you to review the results.   Testing/Procedures: NONE    Follow-Up: At [facility], you and your health needs are our priority.  As part of our continuing mission to provide you with exceptional heart care, we have created designated Provider Care Teams.  These Care Teams include your primary Cardiologist (physician) and Advanced Practice Providers (APPs -  Physician Assistants and Nurse Practitioners) who all work together to provide you with the care you need, when you need it.  We recommend signing up for the patient portal called "MyChart".  Sign up information is provided on this After Visit Summary.  MyChart is used to connect with patients for Virtual Visits (Telemedicine).  Patients are able to view lab/test results, encounter notes, upcoming appointments, etc.  Non-urgent messages can be sent to your provider as well.   To learn more about what you can do with MyChart, go to [email or web address].    Your next appointment:   1 year(s)  Provider:   Lewayne Bunting, MD    Other Instructions Thank you for choosing [facility]!

## 2023-11-03 NOTE — Progress Notes (Signed)
HPI Felicia Ruiz returns today for followup. She is a pleasant 78 yo woman with a h/o tachy/brady syndrome. She was admitted to the hospital a couple of months ago with severe sinus node dysfunction. We held her many AV node blocking drugs and left her on low dose amiodarone. She has an indwelling port a cath which has been in placed for 16 years. I recommended removal but she declined. We reconsidered but ultimately decide to leave in place the port as it was not being used and was thought to be difficult to remove.She has not had chest pain. She has class 3 dyspnea and renal insufficiency. She denies edema.  Since her  PM insertion she has reverted back to NSR and her activity has improved. No syncope. She was in the hospital in [place] with afib and CHF. Her dose of amio was increased.  Allergies  Allergen Reactions   Levofloxacin     UNK reaction - Pt unsure of allergy   Statins     Don't want to take them d/t hearing bad things about statins     Current Outpatient Medications  Medication Sig Dispense Refill   acetaminophen (TYLENOL) 325 MG tablet Take 1-2 tablets (325-650 mg total) by mouth every 4 (four) hours as needed for mild pain.     allopurinol (ZYLOPRIM) 100 MG tablet Take 100 mg by mouth daily.     amiodarone (PACERONE) 200 MG tablet Take 200 mg Daily none on Sunday 90 tablet 3   aspirin 81 MG chewable tablet Chew 81 mg by mouth daily.     Digoxin 62.5 MCG TABS Take 62.5 mcg by mouth daily.     ELIQUIS 5 MG TABS tablet Take 5 mg by mouth 2 (two) times daily.     FARXIGA 10 MG TABS tablet Take 1 tablet by mouth daily.     furosemide (LASIX) 40 MG tablet Take 20 mg by mouth daily.     isosorbide dinitrate (ISORDIL) 10 MG tablet Take 10 mg by mouth 2 (two) times daily.     KLOR-CON M10 10 MEQ tablet Take 10 mEq by mouth daily.     levothyroxine (SYNTHROID) 112 MCG tablet Take 112 mcg by mouth daily before breakfast.     LINZESS 72 MCG capsule Take 72 mcg by mouth 3  (three) times daily.     MAGNESIUM OXIDE PO Take 500 mg by mouth in the morning and at bedtime.     metolazone (ZAROXOLYN) 2.5 MG tablet Take 1 tablet (2.5 mg total) by mouth daily as needed (For weight gain of 3 pounds in a day or 5 pounds in a week.). (Patient taking differently: Take 2.5 mg by mouth daily as needed.) 30 tablet 3   metoprolol succinate (TOPROL-XL) 25 MG 24 hr tablet TAKE 1 TABLET (25 MG) BY MOUTH DAILY 90 tablet 2   Multiple Vitamins-Minerals (ONE-A-DAY WOMENS 50 PLUS PO) Take 1 tablet by mouth daily.     pantoprazole (PROTONIX) 40 MG tablet Take 40 mg by mouth daily.     vitamin B-12 (CYANOCOBALAMIN) 1000 MCG tablet Take 1,000 mcg by mouth daily.     No current facility-administered medications for this visit.     Past Medical History:  Diagnosis Date   Atrial fibrillation ([facility]) 12/18/2020   Chronic kidney disease (CKD) 12/18/2020   Cirrhosis of liver ([facility]) 12/18/2020   Essential hypertension 12/18/2020   HFrEF (heart failure with reduced ejection fraction) ([facility]) 12/18/2020   History of breast cancer  12/18/2020   right   Nonischemic cardiomyopathy ([facility]) 12/18/2020   Pulmonary hypertension ([facility]) 12/18/2020   Substance abuse ([facility])     ROS:   All systems reviewed and negative except as noted in the HPI.   Past Surgical History:  Procedure Laterality Date   BREAST LUMPECTOMY Right    PACEMAKER IMPLANT N/A 04/13/2022   Procedure: PACEMAKER IMPLANT;  Surgeon: Marinus Maw, MD;  Location: [facility];  Service: Cardiovascular;  Laterality: N/A;   PORTA CATH INSERTION Right 2008     Family History  Problem Relation Age of Onset   Hypertension Mother    Hypertension Father      Social History   Socioeconomic History   Marital status: Widowed    Spouse name: Not on file   Number of children: Not on file   Years of education: Not on file   Highest education level: Not on file  Occupational History   Not on file  Tobacco Use   Smoking status:  Never   Smokeless tobacco: Never  Vaping Use   Vaping status: Never Used  Substance and Sexual Activity   Alcohol use: Never   Drug use: Never   Sexual activity: Not Currently    Birth control/protection: Post-menopausal  Other Topics Concern   Not on file  Social History Narrative   Not on file   Social Drivers of Health   Financial Resource Strain: Low Risk  (07/19/2023)   Received [facility]   Overall Financial Resource Strain (CARDIA)    Difficulty of Paying Living Expenses: Not hard at all  Food Insecurity: No Food Insecurity (07/19/2023)   Received from [facility]   Hunger Vital Sign    Worried About Running Out of Food in the Last Year: Never true    Ran Out of Food in the Last Year: Never true  Transportation Needs: No Transportation Needs (07/19/2023)   Received from [facility] - Transportation    Lack of Transportation (Medical): No    Lack of Transportation (Non-Medical): No  Physical Activity: Inactive (07/19/2023)   [facility]   Exercise Vital Sign    Days of Exercise per Week: 0 days    Minutes of Exercise per Session: 0 min  Stress: Stress Concern Present (07/19/2023)   Received from [facility] - Occupational Stress Questionnaire    Feeling of Stress : Rather much  Social Connections: Socially Isolated (07/19/2023)   Received from [facility]   Social Connection and Isolation Panel [NHANES]    Frequency of Communication with Friends and Family: Once a week    Frequency of Social Gatherings with Friends and Family: Twice a week    Attends Religious Services: Never    Database administrator or Organizations: No    Attends Banker Meetings: Never    Marital Status: Widowed  Intimate Partner Violence: Not At Risk (07/19/2023)   Received from [facility]   Humiliation, Afraid, Rape, and Kick questionnaire    Fear of Current or Ex-Partner:  No    Emotionally Abused: No    Physically Abused: No    Sexually Abused: No     BP 120/74   Pulse 78   Ht 5\' 4"  (1.626 m)   Wt 199 lb 9.6 oz (90.5 kg)   SpO2 95%   BMI 34.26 kg/m   Physical Exam:  Chronically ill appearing NAD HEENT: Unremarkable Neck:  No JVD, no thyromegally Lymphatics:  No adenopathy Back:  No CVA tenderness Lungs:  Clear with no wheezes HEART:  Regular rate rhythm, no murmurs, no rubs, no clicks Abd:  soft, positive bowel sounds, no organomegally, no rebound, no guarding Ext:  2 plus pulses, no edema, no cyanosis, no clubbing Skin:  No rashes no nodules Neuro:  CN II through XII intact, motor grossly intact  EKG - nsr with atrial pacing  DEVICE  Normal device function.  See PaceArt for details.   Assess/Plan:  1. Sinus node dysfunction - she is doing well s/p PPM insertion. She is asymptomatic.  2. PAF - she has returned to NSR and I have asked her to decrease her dose of amio to 200 mg daily, none on Sunday. 3. LV dysfunction - her EF normalized at her last echo. She will continue  4. Obesity - she is encouraged to lose weight.    Sharlot Gowda Mishael Krysiak,MD

## 2023-11-23 NOTE — Progress Notes (Signed)
 Remote pacemaker transmission.

## 2023-11-23 NOTE — Addendum Note (Signed)
 Addended by: Geralyn Flash D on: 11/23/2023 01:01 PM   Modules accepted: Orders

## 2024-01-11 ENCOUNTER — Ambulatory Visit (INDEPENDENT_AMBULATORY_CARE_PROVIDER_SITE_OTHER): Payer: Medicare HMO

## 2024-01-11 DIAGNOSIS — I495 Sick sinus syndrome: Secondary | ICD-10-CM | POA: Diagnosis not present

## 2024-01-12 LAB — CUP PACEART REMOTE DEVICE CHECK
Battery Remaining Longevity: 154 mo
Battery Voltage: 3.05 V
Brady Statistic AP VP Percent: 0.09 %
Brady Statistic AP VS Percent: 89.17 %
Brady Statistic AS VP Percent: 0.2 %
Brady Statistic AS VS Percent: 10.54 %
Brady Statistic RA Percent Paced: 89.26 %
Brady Statistic RV Percent Paced: 0.29 %
Date Time Interrogation Session: 20250408223102
Implantable Lead Connection Status: 753985
Implantable Lead Connection Status: 753985
Implantable Lead Implant Date: 20230710
Implantable Lead Implant Date: 20230710
Implantable Lead Location: 753859
Implantable Lead Location: 753860
Implantable Lead Model: 5076
Implantable Lead Model: 5076
Implantable Pulse Generator Implant Date: 20230710
Lead Channel Impedance Value: 285 Ohm
Lead Channel Impedance Value: 418 Ohm
Lead Channel Impedance Value: 437 Ohm
Lead Channel Impedance Value: 494 Ohm
Lead Channel Pacing Threshold Amplitude: 0.5 V
Lead Channel Pacing Threshold Amplitude: 0.875 V
Lead Channel Pacing Threshold Pulse Width: 0.4 ms
Lead Channel Pacing Threshold Pulse Width: 0.4 ms
Lead Channel Sensing Intrinsic Amplitude: 1.625 mV
Lead Channel Sensing Intrinsic Amplitude: 1.625 mV
Lead Channel Sensing Intrinsic Amplitude: 11 mV
Lead Channel Sensing Intrinsic Amplitude: 11 mV
Lead Channel Setting Pacing Amplitude: 1.5 V
Lead Channel Setting Pacing Amplitude: 2 V
Lead Channel Setting Pacing Pulse Width: 0.4 ms
Lead Channel Setting Sensing Sensitivity: 1.2 mV
Zone Setting Status: 755011

## 2024-02-29 NOTE — Addendum Note (Signed)
 Addended by: Lott Rouleau A on: 02/29/2024 10:43 AM   Modules accepted: Orders

## 2024-02-29 NOTE — Progress Notes (Signed)
 Remote pacemaker transmission.

## 2024-04-11 ENCOUNTER — Ambulatory Visit: Payer: Medicare HMO

## 2024-04-11 DIAGNOSIS — I495 Sick sinus syndrome: Secondary | ICD-10-CM | POA: Diagnosis not present

## 2024-04-13 ENCOUNTER — Ambulatory Visit: Payer: Self-pay | Admitting: Internal Medicine

## 2024-04-13 LAB — CUP PACEART REMOTE DEVICE CHECK
Battery Remaining Longevity: 150 mo
Battery Voltage: 3.04 V
Brady Statistic AP VP Percent: 0.07 %
Brady Statistic AP VS Percent: 99.87 %
Brady Statistic AS VP Percent: 0 %
Brady Statistic AS VS Percent: 0.06 %
Brady Statistic RA Percent Paced: 99.93 %
Brady Statistic RV Percent Paced: 0.07 %
Date Time Interrogation Session: 20250708060405
Implantable Lead Connection Status: 753985
Implantable Lead Connection Status: 753985
Implantable Lead Implant Date: 20230710
Implantable Lead Implant Date: 20230710
Implantable Lead Location: 753859
Implantable Lead Location: 753860
Implantable Lead Model: 5076
Implantable Lead Model: 5076
Implantable Pulse Generator Implant Date: 20230710
Lead Channel Impedance Value: 323 Ohm
Lead Channel Impedance Value: 437 Ohm
Lead Channel Impedance Value: 532 Ohm
Lead Channel Impedance Value: 684 Ohm
Lead Channel Pacing Threshold Amplitude: 0.5 V
Lead Channel Pacing Threshold Amplitude: 0.625 V
Lead Channel Pacing Threshold Pulse Width: 0.4 ms
Lead Channel Pacing Threshold Pulse Width: 0.4 ms
Lead Channel Sensing Intrinsic Amplitude: 1.5 mV
Lead Channel Sensing Intrinsic Amplitude: 1.5 mV
Lead Channel Sensing Intrinsic Amplitude: 15.5 mV
Lead Channel Sensing Intrinsic Amplitude: 15.5 mV
Lead Channel Setting Pacing Amplitude: 1.5 V
Lead Channel Setting Pacing Amplitude: 2 V
Lead Channel Setting Pacing Pulse Width: 0.4 ms
Lead Channel Setting Sensing Sensitivity: 1.2 mV
Zone Setting Status: 755011

## 2024-07-11 ENCOUNTER — Ambulatory Visit: Payer: Medicare HMO

## 2024-07-11 DIAGNOSIS — I495 Sick sinus syndrome: Secondary | ICD-10-CM | POA: Diagnosis not present

## 2024-07-13 LAB — CUP PACEART REMOTE DEVICE CHECK
Battery Remaining Longevity: 147 mo
Battery Voltage: 3.04 V
Brady Statistic AP VP Percent: 0.19 %
Brady Statistic AP VS Percent: 97.73 %
Brady Statistic AS VP Percent: 1.61 %
Brady Statistic AS VS Percent: 0.48 %
Brady Statistic RA Percent Paced: 97.91 %
Brady Statistic RV Percent Paced: 1.8 %
Date Time Interrogation Session: 20251007015610
Implantable Lead Connection Status: 753985
Implantable Lead Connection Status: 753985
Implantable Lead Implant Date: 20230710
Implantable Lead Implant Date: 20230710
Implantable Lead Location: 753859
Implantable Lead Location: 753860
Implantable Lead Model: 5076
Implantable Lead Model: 5076
Implantable Pulse Generator Implant Date: 20230710
Lead Channel Impedance Value: 285 Ohm
Lead Channel Impedance Value: 494 Ohm
Lead Channel Impedance Value: 513 Ohm
Lead Channel Impedance Value: 703 Ohm
Lead Channel Pacing Threshold Amplitude: 0.625 V
Lead Channel Pacing Threshold Amplitude: 0.625 V
Lead Channel Pacing Threshold Pulse Width: 0.4 ms
Lead Channel Pacing Threshold Pulse Width: 0.4 ms
Lead Channel Sensing Intrinsic Amplitude: 1.25 mV
Lead Channel Sensing Intrinsic Amplitude: 1.25 mV
Lead Channel Sensing Intrinsic Amplitude: 14 mV
Lead Channel Sensing Intrinsic Amplitude: 14 mV
Lead Channel Setting Pacing Amplitude: 1.5 V
Lead Channel Setting Pacing Amplitude: 2 V
Lead Channel Setting Pacing Pulse Width: 0.4 ms
Lead Channel Setting Sensing Sensitivity: 1.2 mV
Zone Setting Status: 755011

## 2024-07-14 NOTE — Progress Notes (Signed)
 Remote PPM Transmission

## 2024-07-19 ENCOUNTER — Ambulatory Visit: Payer: Self-pay | Admitting: Internal Medicine

## 2024-10-10 ENCOUNTER — Ambulatory Visit: Payer: Medicare HMO

## 2024-10-10 DIAGNOSIS — I495 Sick sinus syndrome: Secondary | ICD-10-CM

## 2024-10-11 LAB — CUP PACEART REMOTE DEVICE CHECK
Battery Remaining Longevity: 142 mo
Battery Voltage: 3.03 V
Brady Statistic AP VP Percent: 0.12 %
Brady Statistic AP VS Percent: 99.75 %
Brady Statistic AS VP Percent: 0.1 %
Brady Statistic AS VS Percent: 0.03 %
Brady Statistic RA Percent Paced: 99.86 %
Brady Statistic RV Percent Paced: 0.22 %
Date Time Interrogation Session: 20260105204709
Implantable Lead Connection Status: 753985
Implantable Lead Connection Status: 753985
Implantable Lead Implant Date: 20230710
Implantable Lead Implant Date: 20230710
Implantable Lead Location: 753859
Implantable Lead Location: 753860
Implantable Lead Model: 5076
Implantable Lead Model: 5076
Implantable Pulse Generator Implant Date: 20230710
Lead Channel Impedance Value: 323 Ohm
Lead Channel Impedance Value: 456 Ohm
Lead Channel Impedance Value: 475 Ohm
Lead Channel Impedance Value: 684 Ohm
Lead Channel Pacing Threshold Amplitude: 0.5 V
Lead Channel Pacing Threshold Amplitude: 0.625 V
Lead Channel Pacing Threshold Pulse Width: 0.4 ms
Lead Channel Pacing Threshold Pulse Width: 0.4 ms
Lead Channel Sensing Intrinsic Amplitude: 1.25 mV
Lead Channel Sensing Intrinsic Amplitude: 1.25 mV
Lead Channel Sensing Intrinsic Amplitude: 12.25 mV
Lead Channel Sensing Intrinsic Amplitude: 12.25 mV
Lead Channel Setting Pacing Amplitude: 1.5 V
Lead Channel Setting Pacing Amplitude: 2 V
Lead Channel Setting Pacing Pulse Width: 0.4 ms
Lead Channel Setting Sensing Sensitivity: 1.2 mV
Zone Setting Status: 755011

## 2024-10-14 ENCOUNTER — Ambulatory Visit: Payer: Self-pay | Admitting: Cardiovascular Disease

## 2024-10-16 NOTE — Progress Notes (Signed)
 Remote PPM Transmission

## 2025-01-09 ENCOUNTER — Ambulatory Visit

## 2025-04-10 ENCOUNTER — Ambulatory Visit

## 2025-07-10 ENCOUNTER — Ambulatory Visit
